# Patient Record
Sex: Female | Born: 1985 | Race: Black or African American | Hispanic: No | Marital: Married | Smoking: Never smoker
Health system: Southern US, Community
[De-identification: ages and names within clinical notes are randomized; demographics above are authoritative.]

## PROBLEM LIST (undated history)

## (undated) ENCOUNTER — Inpatient Hospital Stay (HOSPITAL_COMMUNITY): Payer: Self-pay

## (undated) DIAGNOSIS — L219 Seborrheic dermatitis, unspecified: Secondary | ICD-10-CM

## (undated) DIAGNOSIS — K8010 Calculus of gallbladder with chronic cholecystitis without obstruction: Secondary | ICD-10-CM

## (undated) DIAGNOSIS — K8050 Calculus of bile duct without cholangitis or cholecystitis without obstruction: Secondary | ICD-10-CM

## (undated) DIAGNOSIS — F419 Anxiety disorder, unspecified: Secondary | ICD-10-CM

## (undated) DIAGNOSIS — Z789 Other specified health status: Secondary | ICD-10-CM

## (undated) DIAGNOSIS — E878 Other disorders of electrolyte and fluid balance, not elsewhere classified: Secondary | ICD-10-CM

## (undated) HISTORY — PX: WISDOM TOOTH EXTRACTION: SHX21

## (undated) HISTORY — DX: Other disorders of electrolyte and fluid balance, not elsewhere classified: E87.8

---

## 2011-05-17 ENCOUNTER — Inpatient Hospital Stay (HOSPITAL_COMMUNITY): Payer: Medicaid Other

## 2011-05-17 ENCOUNTER — Inpatient Hospital Stay (HOSPITAL_COMMUNITY)
Admission: AD | Admit: 2011-05-17 | Discharge: 2011-05-17 | Disposition: A | Discharge status: Home / Self Care | Payer: Medicaid Other | Source: Ambulatory Visit | Attending: Obstetrics and Gynecology | Admitting: Obstetrics and Gynecology

## 2011-05-17 DIAGNOSIS — O209 Hemorrhage in early pregnancy, unspecified: Secondary | ICD-10-CM

## 2011-05-17 LAB — URINALYSIS, ROUTINE W REFLEX MICROSCOPIC
Bilirubin Urine: NEGATIVE
Ketones, ur: NEGATIVE mg/dL
Nitrite: NEGATIVE
Urobilinogen, UA: 0.2 mg/dL (ref 0.0–1.0)

## 2011-05-17 LAB — CBC
Hemoglobin: 13.5 g/dL (ref 12.0–15.0)
MCHC: 34.5 g/dL (ref 30.0–36.0)
Platelets: 296 10*3/uL (ref 150–400)
RBC: 4.63 MIL/uL (ref 3.87–5.11)

## 2011-05-17 LAB — WET PREP, GENITAL
Clue Cells Wet Prep HPF POC: NONE SEEN
Trich, Wet Prep: NONE SEEN

## 2011-05-17 LAB — POCT PREGNANCY, URINE: Preg Test, Ur: POSITIVE

## 2011-05-17 LAB — URINE MICROSCOPIC-ADD ON

## 2011-05-17 LAB — ABO/RH: ABO/RH(D): B POS

## 2011-05-18 LAB — GC/CHLAMYDIA PROBE AMP, GENITAL: GC Probe Amp, Genital: NEGATIVE

## 2011-06-23 LAB — HIV ANTIBODY (ROUTINE TESTING W REFLEX): HIV: NONREACTIVE

## 2011-06-23 LAB — RPR: RPR: NONREACTIVE

## 2011-11-29 ENCOUNTER — Ambulatory Visit
Payer: No Typology Code available for payment source | Attending: Obstetrics and Gynecology | Admitting: Physical Therapy

## 2011-11-29 DIAGNOSIS — IMO0001 Reserved for inherently not codable concepts without codable children: Secondary | ICD-10-CM | POA: Insufficient documentation

## 2011-11-29 DIAGNOSIS — M546 Pain in thoracic spine: Secondary | ICD-10-CM | POA: Insufficient documentation

## 2011-11-29 DIAGNOSIS — M2569 Stiffness of other specified joint, not elsewhere classified: Secondary | ICD-10-CM | POA: Insufficient documentation

## 2011-12-01 ENCOUNTER — Ambulatory Visit: Payer: No Typology Code available for payment source | Admitting: Physical Therapy

## 2011-12-06 ENCOUNTER — Ambulatory Visit: Payer: No Typology Code available for payment source | Admitting: Physical Therapy

## 2011-12-08 ENCOUNTER — Ambulatory Visit: Payer: No Typology Code available for payment source | Admitting: Physical Therapy

## 2011-12-12 ENCOUNTER — Encounter: Payer: No Typology Code available for payment source | Admitting: Physical Therapy

## 2011-12-19 ENCOUNTER — Ambulatory Visit: Payer: No Typology Code available for payment source | Admitting: Pediatrics

## 2011-12-20 ENCOUNTER — Ambulatory Visit: Payer: No Typology Code available for payment source | Admitting: Physical Therapy

## 2011-12-25 ENCOUNTER — Inpatient Hospital Stay (HOSPITAL_COMMUNITY)
Admission: AD | Admit: 2011-12-25 | Discharge: 2011-12-29 | DRG: 766 | Disposition: A | Discharge status: Home / Self Care | Payer: Medicaid Other | Source: Ambulatory Visit | Attending: Obstetrics and Gynecology | Admitting: Obstetrics and Gynecology

## 2011-12-25 ENCOUNTER — Encounter (HOSPITAL_COMMUNITY): Payer: Self-pay | Admitting: *Deleted

## 2011-12-25 DIAGNOSIS — IMO0002 Reserved for concepts with insufficient information to code with codable children: Secondary | ICD-10-CM | POA: Diagnosis present

## 2011-12-25 HISTORY — DX: Other specified health status: Z78.9

## 2011-12-25 LAB — COMPREHENSIVE METABOLIC PANEL
ALT: 29 U/L (ref 0–35)
AST: 22 U/L (ref 0–37)
CO2: 22 mEq/L (ref 19–32)
Chloride: 106 mEq/L (ref 96–112)
Creatinine, Ser: 0.61 mg/dL (ref 0.50–1.10)
GFR calc non Af Amer: >90 mL/min (ref >90)
Glucose, Bld: 87 mg/dL (ref 70–99)
Sodium: 140 mEq/L (ref 135–145)
Total Bilirubin: 0.3 mg/dL (ref 0.3–1.2)

## 2011-12-25 LAB — CBC
MCV: 84.4 fL (ref 78.0–100.0)
Platelets: 182 10*3/uL (ref 150–400)
RDW: 14.4 % (ref 11.5–15.5)
WBC: 9.2 10*3/uL (ref 4.0–10.5)

## 2011-12-25 MED ORDER — BUTORPHANOL TARTRATE 2 MG/ML IJ SOLN: 2.0000 mg | INTRAMUSCULAR | Status: DC | PRN

## 2011-12-25 MED ORDER — LACTATED RINGERS IV SOLN
INTRAVENOUS | Status: DC
  Administered 2011-12-25 – 2011-12-26 (×3): via INTRAVENOUS

## 2011-12-25 NOTE — Progress Notes (Signed)
Pt will go to room 173.  Will call when room is available.

## 2011-12-25 NOTE — Progress Notes (Signed)
Pt reports SROM at 1945-note clear fluid running down her legs

## 2011-12-25 NOTE — ED Provider Notes (Signed)
Brenda Lozano is a 26 y.o. female presenting c/o leaking fluid since 7:45p clear fluid.  She reports good fetal movement and ctxs but no bleeding.  History OB History    Grav Para Term Preterm Abortions TAB SAB Ect Mult Living   1              History reviewed. No pertinent past medical history. History reviewed. No pertinent past surgical history. Family History: family history is not on file. Social History:  does not have a smoking history on file. She does not have any smokeless tobacco history on file. Her alcohol and drug histories not on file. H/o HPV s/p Colpo  ROS Noncontributory.  Denies any other symptoms.  Dilation: Fingertip Effacement (%): 50 Station: Ballotable Exam by:: Dr. Su Hilt Blood pressure 146/87, pulse 103, temperature 98.7 F (37.1 C), temperature source Oral, resp. rate 20, height 5\' 4"  (1.626 m), weight 99.791 kg (220 lb), SpO2 97.00%.  Grossly ruptured clear fluid VE FT/50%/P FHT approx 130s, minimal variability, no decels Toco q 3-99min Bedside U/S vtx GBS neg  Exam Physical Exam  Prenatal labs: ABO, Rh: --/--/B POS (06/26 1941) Antibody:  neg Rubella:  Immune RPR:   NR HBsAg:   neg HIV:   NR GBS:   neg Pap neg GC/CT both neg Anatomy scan no anomalies Quad screen not done secondary to pt missed window of time  Assessment/Plan: 25yo P0 @ 40wks with SROM clear fluid and contracting.  Will continue to monitor as pt appears to be moving into labor.  I did discuss the possibility of augmenting with pitocin if she does not continue to progress.  Fetal status is overall reassuring.   Purcell Nails 12/25/2011, 10:12 PM

## 2011-12-25 NOTE — H&P (Addendum)
Brenda Lozano is a 26 y.o. female presenting c/o leaking fluid since 7:45p clear fluid. She reports good fetal movement and ctxs but no bleeding.  History  OB History    Grav  Para  Term  Preterm  Abortions  TAB  SAB  Ect  Mult  Living    1               History reviewed. No pertinent past medical history.  History reviewed. No pertinent past surgical history.  Family History: family history is not on file.  Social History: does not have a smoking history on file. She does not have any smokeless tobacco history on file. Her alcohol and drug histories not on file.  H/o HPV s/p Colpo   ROS  Noncontributory. Denies any other symptoms.   Dilation: Fingertip  Effacement (%): 50  Station: Ballotable  Exam by:: Dr. Su Hilt   Blood pressure 146/87, pulse 103, temperature 98.7 F (37.1 C), temperature source Oral, resp. rate 20, height 5\' 4"  (1.626 m), weight 99.791 kg (220 lb), SpO2 97.00%.  Grossly ruptured clear fluid  VE FT/50%/P  FHT approx 130s, minimal variability, no decels  Toco q 3-66min  Bedside U/S vtx  GBS neg   Exam  Physical Exam  Lungs CTA  bil CV RRR Abd gravid, NT Ext no calf tenderness  Prenatal labs:  ABO, Rh: --/--/B POS (06/26 1941)  Antibody: neg  Rubella: Immune  RPR: NR  HBsAg: neg  HIV: NR  GBS: neg  Pap neg  GC/CT both neg  Anatomy scan no anomalies  Quad screen not done secondary to pt missed window of time  U/S 7lbs 11oz (12/13/11) Fern Test positive  Assessment/Plan:  25yo P0 @ 40wks with SROM clear fluid and contracting. Will continue to monitor as pt appears to be moving into labor. I did discuss the possibility of augmenting with pitocin if she does not continue to progress. Fetal status is overall reassuring.  Purcell Nails  12/25/2011, 10:12 PM

## 2011-12-25 NOTE — Progress Notes (Signed)
Dr. Su Hilt at bedside.  Strip reviewed and VE done.

## 2011-12-26 ENCOUNTER — Encounter (HOSPITAL_COMMUNITY)
Admission: AD | Disposition: A | Discharge status: Home / Self Care | Payer: Self-pay | Source: Ambulatory Visit | Attending: Obstetrics and Gynecology

## 2011-12-26 ENCOUNTER — Encounter (HOSPITAL_COMMUNITY): Payer: Self-pay | Admitting: Anesthesiology

## 2011-12-26 ENCOUNTER — Inpatient Hospital Stay (HOSPITAL_COMMUNITY): Payer: Medicaid Other | Admitting: Anesthesiology

## 2011-12-26 ENCOUNTER — Encounter (HOSPITAL_COMMUNITY): Payer: Self-pay | Admitting: *Deleted

## 2011-12-26 DIAGNOSIS — IMO0002 Reserved for concepts with insufficient information to code with codable children: Secondary | ICD-10-CM | POA: Diagnosis present

## 2011-12-26 LAB — URINALYSIS, ROUTINE W REFLEX MICROSCOPIC
Bilirubin Urine: NEGATIVE
Glucose, UA: NEGATIVE mg/dL
Ketones, ur: 15 mg/dL — AB
Leukocytes, UA: NEGATIVE
Nitrite: NEGATIVE
Protein, ur: NEGATIVE mg/dL
Specific Gravity, Urine: 1.015 (ref 1.005–1.030)
Urobilinogen, UA: 0.2 mg/dL (ref 0.0–1.0)
pH: 6.5 (ref 5.0–8.0)

## 2011-12-26 LAB — URINE MICROSCOPIC-ADD ON

## 2011-12-26 LAB — RPR: RPR Ser Ql: NONREACTIVE

## 2011-12-26 SURGERY — Surgical Case
Anesthesia: Regional | Site: Abdomen | Wound class: Clean Contaminated

## 2011-12-26 MED ORDER — ACETAMINOPHEN 10 MG/ML IV SOLN: 1000.0000 mg | Freq: Four times a day (QID) | INTRAVENOUS | Status: AC | PRN

## 2011-12-26 MED ORDER — FERROUS SULFATE 325 (65 FE) MG PO TABS
325.0000 mg | ORAL_TABLET | Freq: Two times a day (BID) | ORAL | Status: DC
  Administered 2011-12-27 – 2011-12-29 (×4): 325 mg via ORAL
  Filled 2011-12-26 (×4): qty 1

## 2011-12-26 MED ORDER — SODIUM CHLORIDE 0.9 % IV SOLN: 1.0000 ug/kg/h | INTRAVENOUS | Status: DC | PRN

## 2011-12-26 MED ORDER — DIPHENHYDRAMINE HCL 50 MG/ML IJ SOLN: 12.5000 mg | INTRAMUSCULAR | Status: DC | PRN

## 2011-12-26 MED ORDER — CITRIC ACID-SODIUM CITRATE 334-500 MG/5ML PO SOLN
30.0000 mL | ORAL | Status: DC | PRN
  Administered 2011-12-26: 30 mL via ORAL
  Filled 2011-12-26: qty 15

## 2011-12-26 MED ORDER — FENTANYL CITRATE 0.05 MG/ML IJ SOLN
INTRAMUSCULAR | Status: AC
  Filled 2011-12-26: qty 5

## 2011-12-26 MED ORDER — ONDANSETRON HCL 4 MG PO TABS: 4.0000 mg | ORAL_TABLET | ORAL | Status: DC | PRN

## 2011-12-26 MED ORDER — DIPHENHYDRAMINE HCL 25 MG PO CAPS: 25.0000 mg | ORAL_CAPSULE | Freq: Four times a day (QID) | ORAL | Status: DC | PRN

## 2011-12-26 MED ORDER — SENNOSIDES-DOCUSATE SODIUM 8.6-50 MG PO TABS
2.0000 | ORAL_TABLET | Freq: Every day | ORAL | Status: DC
  Administered 2011-12-27 – 2011-12-28 (×2): 2 via ORAL

## 2011-12-26 MED ORDER — FENTANYL CITRATE 0.05 MG/ML IJ SOLN
INTRAMUSCULAR | Status: DC | PRN
  Administered 2011-12-26 (×2): 50 ug via INTRAVENOUS

## 2011-12-26 MED ORDER — MORPHINE SULFATE (PF) 0.5 MG/ML IJ SOLN
INTRAMUSCULAR | Status: DC | PRN
  Administered 2011-12-26: 3 mg via EPIDURAL
  Administered 2011-12-26: 2 mg via INTRAVENOUS

## 2011-12-26 MED ORDER — BUPIVACAINE HCL (PF) 0.25 % IJ SOLN
INTRAMUSCULAR | Status: AC
  Filled 2011-12-26: qty 30

## 2011-12-26 MED ORDER — OXYCODONE-ACETAMINOPHEN 5-325 MG PO TABS: 1.0000 | ORAL_TABLET | ORAL | Status: DC | PRN

## 2011-12-26 MED ORDER — SIMETHICONE 80 MG PO CHEW
80.0000 mg | CHEWABLE_TABLET | Freq: Three times a day (TID) | ORAL | Status: DC
  Administered 2011-12-26 – 2011-12-29 (×9): 80 mg via ORAL

## 2011-12-26 MED ORDER — NALBUPHINE HCL 10 MG/ML IJ SOLN
5.0000 mg | INTRAMUSCULAR | Status: DC | PRN
  Administered 2011-12-26: 10 mg via INTRAVENOUS
  Filled 2011-12-26: qty 1

## 2011-12-26 MED ORDER — DIPHENHYDRAMINE HCL 50 MG/ML IJ SOLN: 25.0000 mg | INTRAMUSCULAR | Status: DC | PRN

## 2011-12-26 MED ORDER — KETOROLAC TROMETHAMINE 30 MG/ML IJ SOLN
30.0000 mg | Freq: Four times a day (QID) | INTRAMUSCULAR | Status: AC | PRN
  Administered 2011-12-26: 30 mg via INTRAVENOUS
  Filled 2011-12-26: qty 1

## 2011-12-26 MED ORDER — LACTATED RINGERS IV SOLN
500.0000 mL | INTRAVENOUS | Status: DC | PRN
  Administered 2011-12-26: 500 mL via INTRAVENOUS

## 2011-12-26 MED ORDER — WITCH HAZEL-GLYCERIN EX PADS: 1.0000 "application " | MEDICATED_PAD | CUTANEOUS | Status: DC | PRN

## 2011-12-26 MED ORDER — LANOLIN HYDROUS EX OINT: 1.0000 "application " | TOPICAL_OINTMENT | CUTANEOUS | Status: DC | PRN

## 2011-12-26 MED ORDER — IBUPROFEN 600 MG PO TABS
600.0000 mg | ORAL_TABLET | Freq: Four times a day (QID) | ORAL | Status: DC
  Administered 2011-12-27 – 2011-12-29 (×8): 600 mg via ORAL
  Filled 2011-12-26 (×9): qty 1

## 2011-12-26 MED ORDER — MORPHINE SULFATE 0.5 MG/ML IJ SOLN
INTRAMUSCULAR | Status: AC
  Filled 2011-12-26: qty 10

## 2011-12-26 MED ORDER — OXYTOCIN 10 UNIT/ML IJ SOLN
INTRAMUSCULAR | Status: AC
  Filled 2011-12-26: qty 2

## 2011-12-26 MED ORDER — METHYLERGONOVINE MALEATE 0.2 MG PO TABS
0.2000 mg | ORAL_TABLET | ORAL | Status: DC | PRN
Start: 2011-12-26 — End: 2011-12-29

## 2011-12-26 MED ORDER — OXYCODONE-ACETAMINOPHEN 5-325 MG PO TABS
1.0000 | ORAL_TABLET | ORAL | Status: DC | PRN
  Administered 2011-12-27 – 2011-12-29 (×5): 1 via ORAL
  Filled 2011-12-26 (×5): qty 1

## 2011-12-26 MED ORDER — NALOXONE HCL 0.4 MG/ML IJ SOLN
0.4000 mg | INTRAMUSCULAR | Status: DC | PRN
Start: 2011-12-26 — End: 2011-12-29

## 2011-12-26 MED ORDER — EPHEDRINE 5 MG/ML INJ: 10.0000 mg | INTRAVENOUS | Status: DC | PRN

## 2011-12-26 MED ORDER — EPHEDRINE 5 MG/ML INJ
10.0000 mg | INTRAVENOUS | Status: DC | PRN
  Filled 2011-12-26: qty 4

## 2011-12-26 MED ORDER — ACETAMINOPHEN 325 MG PO TABS: 650.0000 mg | ORAL_TABLET | ORAL | Status: DC | PRN

## 2011-12-26 MED ORDER — ONDANSETRON HCL 4 MG/2ML IJ SOLN: 4.0000 mg | Freq: Four times a day (QID) | INTRAMUSCULAR | Status: DC | PRN

## 2011-12-26 MED ORDER — KETOROLAC TROMETHAMINE 30 MG/ML IJ SOLN: 30.0000 mg | Freq: Four times a day (QID) | INTRAMUSCULAR | Status: AC | PRN

## 2011-12-26 MED ORDER — CITRIC ACID-SODIUM CITRATE 334-500 MG/5ML PO SOLN
ORAL | Status: AC
  Administered 2011-12-26: 30 mL via ORAL
  Filled 2011-12-26: qty 15

## 2011-12-26 MED ORDER — ONDANSETRON HCL 4 MG/2ML IJ SOLN: 4.0000 mg | INTRAMUSCULAR | Status: DC | PRN

## 2011-12-26 MED ORDER — PHENYLEPHRINE 40 MCG/ML (10ML) SYRINGE FOR IV PUSH (FOR BLOOD PRESSURE SUPPORT): 80.0000 ug | PREFILLED_SYRINGE | INTRAVENOUS | Status: DC | PRN

## 2011-12-26 MED ORDER — PROMETHAZINE HCL 25 MG/ML IJ SOLN
12.5000 mg | INTRAMUSCULAR | Status: DC | PRN
  Administered 2011-12-26 (×2): 12.5 mg via INTRAVENOUS
  Filled 2011-12-26 (×2): qty 1

## 2011-12-26 MED ORDER — NALBUPHINE HCL 10 MG/ML IJ SOLN: 5.0000 mg | INTRAMUSCULAR | Status: DC | PRN

## 2011-12-26 MED ORDER — TETANUS-DIPHTH-ACELL PERTUSSIS 5-2.5-18.5 LF-MCG/0.5 IM SUSP
0.5000 mL | Freq: Once | INTRAMUSCULAR | Status: AC
  Administered 2011-12-27: 0.5 mL via INTRAMUSCULAR
  Filled 2011-12-26: qty 0.5

## 2011-12-26 MED ORDER — DIPHENHYDRAMINE HCL 25 MG PO CAPS
25.0000 mg | ORAL_CAPSULE | ORAL | Status: DC | PRN
  Administered 2011-12-28: 25 mg via ORAL
  Filled 2011-12-26: qty 1

## 2011-12-26 MED ORDER — FLEET ENEMA 7-19 GM/118ML RE ENEM: 1.0000 | ENEMA | RECTAL | Status: DC | PRN

## 2011-12-26 MED ORDER — LIDOCAINE-EPINEPHRINE (PF) 2 %-1:200000 IJ SOLN
INTRAMUSCULAR | Status: AC
  Filled 2011-12-26: qty 20

## 2011-12-26 MED ORDER — MEASLES, MUMPS & RUBELLA VAC ~~LOC~~ INJ: 0.5000 mL | INJECTION | Freq: Once | SUBCUTANEOUS | Status: DC

## 2011-12-26 MED ORDER — SCOPOLAMINE 1 MG/3DAYS TD PT72: 1.0000 | MEDICATED_PATCH | Freq: Once | TRANSDERMAL | Status: DC

## 2011-12-26 MED ORDER — METHYLERGONOVINE MALEATE 0.2 MG/ML IJ SOLN: 0.2000 mg | INTRAMUSCULAR | Status: DC | PRN

## 2011-12-26 MED ORDER — SODIUM CHLORIDE 0.9 % IJ SOLN: 3.0000 mL | INTRAMUSCULAR | Status: DC | PRN

## 2011-12-26 MED ORDER — LIDOCAINE HCL (PF) 1 % IJ SOLN: 30.0000 mL | INTRAMUSCULAR | Status: DC | PRN

## 2011-12-26 MED ORDER — ZOLPIDEM TARTRATE 5 MG PO TABS: 5.0000 mg | ORAL_TABLET | Freq: Every evening | ORAL | Status: DC | PRN

## 2011-12-26 MED ORDER — SODIUM BICARBONATE 8.4 % IV SOLN
INTRAVENOUS | Status: AC
  Filled 2011-12-26: qty 50

## 2011-12-26 MED ORDER — ONDANSETRON HCL 4 MG/2ML IJ SOLN: 4.0000 mg | Freq: Three times a day (TID) | INTRAMUSCULAR | Status: DC | PRN

## 2011-12-26 MED ORDER — LIDOCAINE HCL 1.5 % IJ SOLN
INTRAMUSCULAR | Status: DC | PRN
Start: 2011-12-26 — End: 2011-12-26
  Administered 2011-12-26 (×2): 5 mL via EPIDURAL

## 2011-12-26 MED ORDER — PHENYLEPHRINE 40 MCG/ML (10ML) SYRINGE FOR IV PUSH (FOR BLOOD PRESSURE SUPPORT)
80.0000 ug | PREFILLED_SYRINGE | INTRAVENOUS | Status: DC | PRN
  Filled 2011-12-26: qty 5

## 2011-12-26 MED ORDER — PROMETHAZINE HCL 25 MG/ML IJ SOLN: 6.2500 mg | INTRAMUSCULAR | Status: DC | PRN

## 2011-12-26 MED ORDER — ACETAMINOPHEN 325 MG PO TABS: 325.0000 mg | ORAL_TABLET | ORAL | Status: DC | PRN

## 2011-12-26 MED ORDER — DIBUCAINE 1 % RE OINT: 1.0000 "application " | TOPICAL_OINTMENT | RECTAL | Status: DC | PRN

## 2011-12-26 MED ORDER — CEFAZOLIN SODIUM-DEXTROSE 2-3 GM-% IV SOLR
2.0000 g | Freq: Once | INTRAVENOUS | Status: AC
  Administered 2011-12-26 (×2): 2 g via INTRAVENOUS
  Filled 2011-12-26: qty 50

## 2011-12-26 MED ORDER — IBUPROFEN 600 MG PO TABS: 600.0000 mg | ORAL_TABLET | Freq: Four times a day (QID) | ORAL | Status: DC | PRN

## 2011-12-26 MED ORDER — FENTANYL 2.5 MCG/ML BUPIVACAINE 1/10 % EPIDURAL INFUSION (WH - ANES)
14.0000 mL/h | INTRAMUSCULAR | Status: DC
  Administered 2011-12-26 (×2): 14 mL/h via EPIDURAL
  Filled 2011-12-26 (×2): qty 60

## 2011-12-26 MED ORDER — BUPIVACAINE HCL (PF) 0.25 % IJ SOLN
INTRAMUSCULAR | Status: DC | PRN
  Administered 2011-12-26: 20 mL

## 2011-12-26 MED ORDER — SODIUM BICARBONATE 8.4 % IV SOLN
INTRAVENOUS | Status: DC | PRN
  Administered 2011-12-26: 5 mL via EPIDURAL

## 2011-12-26 MED ORDER — OXYTOCIN 20 UNITS IN LACTATED RINGERS INFUSION - SIMPLE
INTRAVENOUS | Status: AC
  Filled 2011-12-26: qty 1000

## 2011-12-26 MED ORDER — BUTORPHANOL TARTRATE 2 MG/ML IJ SOLN
1.0000 mg | INTRAMUSCULAR | Status: DC | PRN
  Administered 2011-12-26 (×3): 1 mg via INTRAVENOUS
  Filled 2011-12-26 (×3): qty 1

## 2011-12-26 MED ORDER — ONDANSETRON HCL 4 MG/2ML IJ SOLN
INTRAMUSCULAR | Status: AC
  Filled 2011-12-26: qty 2

## 2011-12-26 MED ORDER — PRENATAL MULTIVITAMIN CH
1.0000 | ORAL_TABLET | Freq: Every day | ORAL | Status: DC
  Administered 2011-12-28 – 2011-12-29 (×2): 1 via ORAL
  Filled 2011-12-26 (×2): qty 1

## 2011-12-26 MED ORDER — PRENATAL MULTIVITAMIN CH: 1.0000 | ORAL_TABLET | Freq: Every day | ORAL | Status: DC

## 2011-12-26 MED ORDER — OXYTOCIN BOLUS FROM INFUSION
500.0000 mL | Freq: Once | INTRAVENOUS | Status: DC
  Filled 2011-12-26: qty 500

## 2011-12-26 MED ORDER — MEPERIDINE HCL 25 MG/ML IJ SOLN: 6.2500 mg | INTRAMUSCULAR | Status: DC | PRN

## 2011-12-26 MED ORDER — SIMETHICONE 80 MG PO CHEW: 80.0000 mg | CHEWABLE_TABLET | ORAL | Status: DC | PRN

## 2011-12-26 MED ORDER — OXYTOCIN 20 UNITS IN LACTATED RINGERS INFUSION - SIMPLE
1.0000 m[IU]/min | INTRAVENOUS | Status: DC
  Administered 2011-12-26: 1 m[IU]/min via INTRAVENOUS

## 2011-12-26 MED ORDER — FENTANYL CITRATE 0.05 MG/ML IJ SOLN: 25.0000 ug | INTRAMUSCULAR | Status: DC | PRN

## 2011-12-26 MED ORDER — MENTHOL 3 MG MT LOZG: 1.0000 | LOZENGE | OROMUCOSAL | Status: DC | PRN

## 2011-12-26 MED ORDER — OXYTOCIN 20 UNITS IN LACTATED RINGERS INFUSION - SIMPLE
125.0000 mL/h | Freq: Once | INTRAVENOUS | Status: DC
  Filled 2011-12-26: qty 1000

## 2011-12-26 MED ORDER — METOCLOPRAMIDE HCL 5 MG/ML IJ SOLN: 10.0000 mg | Freq: Three times a day (TID) | INTRAMUSCULAR | Status: DC | PRN

## 2011-12-26 MED ORDER — OXYTOCIN 20 UNITS IN LACTATED RINGERS INFUSION - SIMPLE
125.0000 mL/h | INTRAVENOUS | Status: AC
  Administered 2011-12-26: 125 mL/h via INTRAVENOUS

## 2011-12-26 MED ORDER — TERBUTALINE SULFATE 1 MG/ML IJ SOLN: 0.2500 mg | Freq: Once | INTRAMUSCULAR | Status: DC | PRN

## 2011-12-26 MED ORDER — LACTATED RINGERS IV SOLN
500.0000 mL | Freq: Once | INTRAVENOUS | Status: AC
  Administered 2011-12-26 (×2): via INTRAVENOUS
  Administered 2011-12-26: 500 mL via INTRAVENOUS

## 2011-12-26 SURGICAL SUPPLY — 34 items
BENZOIN TINCTURE PRP APPL 2/3 (GAUZE/BANDAGES/DRESSINGS) ×2 IMPLANT
BOOTIES KNEE HIGH SLOAN (MISCELLANEOUS) ×4 IMPLANT
CHLORAPREP W/TINT 26ML (MISCELLANEOUS) ×2 IMPLANT
CLOTH BEACON ORANGE TIMEOUT ST (SAFETY) ×2 IMPLANT
DRAIN JACKSON PRT FLT 10 (DRAIN) IMPLANT
DRESSING TELFA 8X3 (GAUZE/BANDAGES/DRESSINGS) ×2 IMPLANT
ELECT REM PT RETURN 9FT ADLT (ELECTROSURGICAL) ×2
ELECTRODE REM PT RTRN 9FT ADLT (ELECTROSURGICAL) ×1 IMPLANT
EVACUATOR SILICONE 100CC (DRAIN) IMPLANT
EXTRACTOR VACUUM M CUP 4 TUBE (SUCTIONS) IMPLANT
GAUZE SPONGE 4X4 12PLY STRL LF (GAUZE/BANDAGES/DRESSINGS) ×4 IMPLANT
GLOVE BIOGEL PI IND STRL 7.0 (GLOVE) ×2 IMPLANT
GLOVE BIOGEL PI INDICATOR 7.0 (GLOVE) ×2
GLOVE ECLIPSE 6.5 STRL STRAW (GLOVE) ×2 IMPLANT
GOWN PREVENTION PLUS LG XLONG (DISPOSABLE) ×6 IMPLANT
KIT ABG SYR 3ML LUER SLIP (SYRINGE) IMPLANT
NEEDLE HYPO 22GX1.5 SAFETY (NEEDLE) ×4 IMPLANT
NEEDLE HYPO 25X5/8 SAFETYGLIDE (NEEDLE) IMPLANT
NS IRRIG 1000ML POUR BTL (IV SOLUTION) ×2 IMPLANT
PACK C SECTION WH (CUSTOM PROCEDURE TRAY) ×2 IMPLANT
PAD ABD 7.5X8 STRL (GAUZE/BANDAGES/DRESSINGS) ×2 IMPLANT
RTRCTR C-SECT PINK 25CM LRG (MISCELLANEOUS) ×2 IMPLANT
SLEEVE SCD COMPRESS KNEE MED (MISCELLANEOUS) ×2 IMPLANT
STRIP CLOSURE SKIN 1/2X4 (GAUZE/BANDAGES/DRESSINGS) ×2 IMPLANT
SUT CHROMIC GUT AB #0 18 (SUTURE) IMPLANT
SUT MNCRL AB 3-0 PS2 27 (SUTURE) ×2 IMPLANT
SUT SILK 2 0 FSL 18 (SUTURE) IMPLANT
SUT VIC AB 0 CTX 36 (SUTURE) ×2
SUT VIC AB 0 CTX36XBRD ANBCTRL (SUTURE) ×2 IMPLANT
SUT VIC AB 1 CT1 36 (SUTURE) ×4 IMPLANT
SYR 20CC LL (SYRINGE) ×2 IMPLANT
TOWEL OR 17X24 6PK STRL BLUE (TOWEL DISPOSABLE) ×4 IMPLANT
TRAY FOLEY CATH 14FR (SET/KITS/TRAYS/PACK) ×2 IMPLANT
WATER STERILE IRR 1000ML POUR (IV SOLUTION) ×2 IMPLANT

## 2011-12-26 NOTE — Anesthesia Postprocedure Evaluation (Signed)
Anesthesia Post Note  Patient: Brenda Lozano  Procedure(s) Performed:  CESAREAN SECTION  Anesthesia type: Epidural  Patient location: PACU  Post pain: Pain level controlled  Post assessment: Post-op Vital signs reviewed  Last Vitals:  Filed Vitals:   12/26/11 1615  BP: 123/84  Pulse: 96  Temp:   Resp: 23    Post vital signs: Reviewed  Level of consciousness: awake  Complications: No apparent anesthesia complications

## 2011-12-26 NOTE — Anesthesia Preprocedure Evaluation (Signed)

## 2011-12-26 NOTE — Addendum Note (Signed)
Addendum  created 12/26/11 1725 by Korban Shearer, CRNA   Modules edited:Notes Section    

## 2011-12-26 NOTE — Progress Notes (Signed)
  Subjective: pain a 5 with contractions, agrees to PItocin   Objective: BP 136/73  Pulse 91  Temp(Src) 98.7 F (37.1 C) (Oral)  Resp 20  Ht 5\' 4"  (1.626 m)  Wt 220 lb (99.791 kg)  BMI 37.76 kg/m2  SpO2 98%      FHT:  120 LTV min to mod UC:   regular, every 2-6 minutes SVE:   Dilation: 1 Effacement (%): 100 Station: -1 Exam by:: Joice Lofts CNM  Labs: Lab Results  Component Value Date   WBC 9.2 12/25/2011   HGB 12.8 12/25/2011   HCT 37.8 12/25/2011   MCV 84.4 12/25/2011   PLT 182 12/25/2011   Results for orders placed during the hospital encounter of 12/25/11 (from the past 24 hour(s))  POCT FERN TEST     Status: Normal   Collection Time   12/25/11 10:00 PM      Component Value Range   Fern Test Positive    COMPREHENSIVE METABOLIC PANEL     Status: Abnormal   Collection Time   12/25/11 10:35 PM      Component Value Range   Sodium 140  135 - 145 (mEq/L)   Potassium 4.0  3.5 - 5.1 (mEq/L)   Chloride 106  96 - 112 (mEq/L)   CO2 22  19 - 32 (mEq/L)   Glucose, Bld 87  70 - 99 (mg/dL)   BUN 5 (*) 6 - 23 (mg/dL)   Creatinine, Ser 6.96  0.50 - 1.10 (mg/dL)   Calcium 9.6  8.4 - 29.5 (mg/dL)   Total Protein 6.1  6.0 - 8.3 (g/dL)   Albumin 2.7 (*) 3.5 - 5.2 (g/dL)   AST 22  0 - 37 (U/L)   ALT 29  0 - 35 (U/L)   Alkaline Phosphatase 127 (*) 39 - 117 (U/L)   Total Bilirubin 0.3  0.3 - 1.2 (mg/dL)   GFR calc non Af Amer >90  >90 (mL/min)   GFR calc Af Amer >90  >90 (mL/min)  LACTATE DEHYDROGENASE     Status: Normal   Collection Time   12/25/11 10:35 PM      Component Value Range   LD 249  94 - 250 (U/L)  URIC ACID     Status: Normal   Collection Time   12/25/11 10:35 PM      Component Value Range   Uric Acid, Serum 4.4  2.4 - 7.0 (mg/dL)  CBC     Status: Normal   Collection Time   12/25/11 10:54 PM      Component Value Range   WBC 9.2  4.0 - 10.5 (K/uL)   RBC 4.48  3.87 - 5.11 (MIL/uL)   Hemoglobin 12.8  12.0 - 15.0 (g/dL)   HCT 28.4  13.2 - 44.0 (%)   MCV 84.4  78.0 -  100.0 (fL)   MCH 28.6  26.0 - 34.0 (pg)   MCHC 33.9  30.0 - 36.0 (g/dL)   RDW 10.2  72.5 - 36.6 (%)   Platelets 182  150 - 400 (K/uL)   Assessment / Plan: SROM 1/7 week IUP HTN Plan Pitocin augmenation, IV meds now, epidural if desires, discussed limited vag exams with RN, Sawsan Riggio 12/26/2011, 2:12 AM

## 2011-12-26 NOTE — Progress Notes (Signed)
Asleep, not disturbed O VSS      Fhts 120 LTV min now with accels last 20 minutes      uc q 1-6 coupling      Vag not assessed A 40 1/7 week IUP     SROM since 1930 P IV pitocin at 4 continue care Lavera Guise, CNM

## 2011-12-26 NOTE — Addendum Note (Signed)
Addendum  created 12/26/11 1707 by Velna Hatchet, MD   Modules edited:Orders, PRL Based Order Sets

## 2011-12-26 NOTE — Progress Notes (Signed)
Patient ID: Celenia Hruska, female   DOB: [birth year], 26 y.o.   MRN: [id number] .Subjective: Comfortable with epidural   Objective: BP 122/70  Pulse 83  Temp(Src) 97.9 F (36.6 C) (Oral)  Resp 18  Ht 5\' 4"  (1.626 m)  Wt 99.791 kg (220 lb)  BMI 37.76 kg/m2  SpO2 100%   FHT:  FHR: 130 bpm, variability: marked,  accelerations:  Present,  decelerations:  Present late/variables UC:   regular, every 2-3  minutes SVE:   Dilation: 7 Effacement (%): 100 Station: 0 Exam by:: S.Tawona Filsinger CNM    Assessment / Plan: Induction of labor due to SROM,  progressing well on pitocin GBS  IUPC and FSE placed without difficulty  Fetal Wellbeing:  Category II Pain Control:  Epidural  Dr Estanislado Pandy updated  Malissa Hippo 12/26/2011, 1:49 PM

## 2011-12-26 NOTE — Op Note (Signed)
Preoperative diagnosis: Intrauterine pregnancy at 40 weeks and 1 days with failure to progress and non-reassuring fetal heart rate  Post operative diagnosis: Same  Anesthesia: Spinal  Anesthesiologist: Dr. Brayton Caves  Procedure: Primary low transverse cesarean section  Surgeon: Dr. Dois Davenport Joury Allcorn  Assistant: Sanda Klein CNM  Estimated blood loss: 700 cc  Procedure:  After being informed of the planned procedure and possible complications including bleeding, infection, injury to other organs, informed consent is obtained. The patient is taken to OR #1 and given spinal anesthesia without complication. She is placed in the dorsal decubitus position with the pelvis tilted to the left. She is then prepped and draped in a sterile fashion. A Foley catheter is inserted in her bladder.  After assessing adequate level of anesthesia, we infiltrate the suprapubic area with 20 cc of Marcaine 0.25 and perform a Pfannenstiel incision which is brought down sharply to the fascia. The fascia is entered in a low transverse fashion. Linea alba is dissected. Peritoneum is entered in a midline fashion. An Alexis retractor is easily positioned. Visceral peritoneum is entered in a low transverse fashion allowing [place] to safely retract bladder by developing a bladder flap.  The myometrium is then entered in a low transverse fashion; first with knife and then extended bluntly. Amniotic fluid is clear. We assist the birth of a female  infant in vertex presentation. Mouth and nose are suctioned.We note a shoulder cord loop. The baby is delivered. The cord is clamped and sectioned. The baby is given to the neonatologist present in the room.  The placenta is allowed to deliver spontaneously. It is complete and the cord has 3 vessels. Uterine revision is negative. Placenta is sent for cord blood donation.  We proceed with closure of the myometrium in 2 layers: First with a running locked suture of 0 Vicryl, then with  a Lembert suture of 0 Vicryl imbricating the first one. Hemostasis is completed with cauterization on peritoneal edges.  Both paracolic gutters are cleaned. Both tubes and ovaries are assessed and normal. The pelvis is profusely irrigated with warm saline to confirm a satisfactory hemostasis.  Retractors and sponges are removed. Under fascia hemostasis is completed with cauterization. The fascia is then closed with 2 running sutures of 0 Vicryl meeting midline. The wound is irrigated with warm saline and hemostasis is completed with cauterization. The skin is closed with a subcuticular suture of 3-0 Monocryl and Steri-Strips.  Instrument and sponge count is complete x2. Estimated blood loss is 700 cc.  The procedure is well tolerated by the patient who is taken to recovery room in a well and stable condition.  female baby named Thurston Pounds was born at 14:55 and received an Apgar of 8  at 1 minute and 9 at 5 minutes. Weight is pending.   Specimen: Placenta sent to [facility] & [facility]   Katheleen Stella A MD 2/4/20133:47 PM

## 2011-12-26 NOTE — Progress Notes (Signed)
Patient ID: Brenda Lozano, female   DOB: 1986/01/06, 26 y.o.   MRN: [id number] .Subjective: Has epidural now   Objective: BP 123/75  Pulse 97  Temp(Src) 98.1 F (36.7 C) (Oral)  Resp 20  Ht 5\' 4"  (1.626 m)  Wt 99.791 kg (220 lb)  BMI 37.76 kg/m2  SpO2 100%   FHT:  FHR: 130 bpm, variability: moderate,  accelerations:  Present,  decelerations:  Present late/variables UC:   regular, every 2-3 minutes SVE:   Dilation: 5.5 Effacement (%): 90 Station: -1;0 Exam by:: Brenda Lozano RNC   Assessment / Plan: SROM active labor GBS neg  Fetal Wellbeing:  Category II Pain Control:  Epidural  Update physician PRN  Brenda Lozano M 12/26/2011, 9:15 AM

## 2011-12-26 NOTE — Progress Notes (Signed)
C/o of painful uc O Fhts 120s LTV mod accels     uc q 3 mild to mod prolonged at times     Vag not assessed A 40 17 week IUP     SROM P IV meds now if not effective plan epidural Lavera Guise, CNM

## 2011-12-26 NOTE — Transfer of Care (Signed)
Immediate Anesthesia Transfer of Care Note  Patient: Brenda Lozano  Procedure(s) Performed:  CESAREAN SECTION  Patient Location: PACU  Anesthesia Type: Spinal  Level of Consciousness: awake, alert  and oriented  Airway & Oxygen Therapy: Patient Spontanous Breathing  Post-op Assessment: Report given to PACU RN and Post -op Vital signs reviewed and stable  Post vital signs: stable  Complications: No apparent anesthesia complications

## 2011-12-26 NOTE — Progress Notes (Signed)
Pt offered pain meds.  Denies need at this time.

## 2011-12-26 NOTE — Brief Op Note (Signed)
12/25/2011 - 12/26/2011  3:42 PM  PATIENT:  Brenda Lozano  26 y.o. female  PRE-OPERATIVE DIAGNOSIS: IUP at 40+1 weeks, Failure to progress and Non-reasuring fetal heart tracing  POST-OPERATIVE DIAGNOSIS:  same    Procedure(s): CESAREAN SECTION    Surgeon(s): Esmeralda Arthur, MD   ASSISTANTS: Sanda Klein CNM   ANESTHESIA:   local and epidural  LOCAL MEDICATIONS USED:  MARCAINE 20 CC  SPECIMEN:  placenta  DISPOSITION OF SPECIMEN:  L & D  COUNTS:  YES  ESTIMATED BLOOD LOSS: 700 cc  PATIENT DISPOSITION:  PACU - hemodynamically stable.       [facility] AMD 12/26/2011 3:42 PM

## 2011-12-26 NOTE — Progress Notes (Signed)
S. Lillard oncoming shift at 0700 update at 0600 on pt status as written. Lavera Guise, CNM

## 2011-12-26 NOTE — Addendum Note (Signed)
Addendum  created 12/26/11 1725 by Truitt Leep, CRNA   Modules edited:Notes Section

## 2011-12-26 NOTE — Progress Notes (Signed)
Discussed with pt FHR pattern and lack of progress and that c/s was recommended at this time.  Explained risks of increased bleeding, damage to organs, infection and anesthesia complications. Pt agrees to proceed Dr. Estanislado Pandy talked to pt as well.

## 2011-12-26 NOTE — Anesthesia Procedure Notes (Signed)
Epidural Patient location during procedure: OB Start time: 12/26/2011 8:00 AM  Staffing Anesthesiologist: Brayton Caves R Performed by: anesthesiologist   Preanesthetic Checklist Completed: patient identified, site marked, surgical consent, pre-op evaluation, timeout performed, IV checked, risks and benefits discussed and monitors and equipment checked  Epidural Patient position: sitting Prep: site prepped and draped and DuraPrep Patient monitoring: continuous pulse ox and blood pressure Approach: midline Injection technique: LOR air and LOR saline  Needle:  Needle type: Tuohy  Needle gauge: 17 G Needle length: 9 cm Needle insertion depth: 8 cm Catheter type: closed end flexible Catheter size: 19 Gauge Catheter at skin depth: 13 cm Test dose: negative  Assessment Events: blood not aspirated, injection not painful, no injection resistance, negative IV test and no paresthesia  Additional Notes Patient identified.  Risk benefits discussed including failed block, incomplete pain control, headache, nerve damage, paralysis, blood pressure changes, nausea, vomiting, reactions to medication both toxic or allergic, and postpartum back pain.  Patient expressed understanding and wished to proceed.  All questions were answered.  Sterile technique used throughout procedure and epidural site dressed with sterile barrier dressing. No paresthesia or other complications noted.The patient did not experience any signs of intravascular injection such as tinnitus or metallic taste in mouth nor signs of intrathecal spread such as rapid motor block. Please see nursing notes for vital signs.

## 2011-12-26 NOTE — Progress Notes (Signed)
Pt may go to room 173 at this time.

## 2011-12-26 NOTE — Progress Notes (Signed)
S: called to evaluate pt for NRFHR and FTP.  O: Currently on Pitocin at 4 mU/min with ctx every 5-8 minutes and MVU around 110      FHR at 135 bpm with repetitive late deceleration      With D/C Pitocin FHR is reassuring      VE: unchanged at 7 cm for 2 hours      BP 135/93  A: NRFHR with FTP  P: recommend to proceed with cesarean section     Procedure and possible complications reviewed with pt and FOB with family in room     Questions answered. Patient agreeable to proceed

## 2011-12-26 NOTE — Anesthesia Postprocedure Evaluation (Signed)
  Anesthesia Post-op Note  Patient: Brenda Lozano  Procedure(s) Performed:  CESAREAN SECTION  Patient Location: PACU and Mother/Baby  Anesthesia Type: Epidural  Level of Consciousness: awake, alert  and oriented  Airway and Oxygen Therapy: Patient Spontanous Breathing  Post-op Pain: mild  Post-op Assessment: Post-op Vital signs reviewed, Patient's Cardiovascular Status Stable and Respiratory Function Stable  Post-op Vital Signs: stable  Complications: No apparent anesthesia complications

## 2011-12-27 ENCOUNTER — Encounter (HOSPITAL_COMMUNITY): Payer: Self-pay | Admitting: Obstetrics and Gynecology

## 2011-12-27 LAB — CREATININE CLEARANCE, URINE, 24 HOUR
Creatinine Clearance: 167 mL/min — ABNORMAL HIGH (ref 75–115)
Creatinine, 24H Ur: 2111 mg/d — ABNORMAL HIGH (ref 700–1800)
Creatinine, Urine: 80.42 mg/dL
Creatinine: 0.88 mg/dL (ref 0.50–1.10)

## 2011-12-27 LAB — CBC
Hemoglobin: 11 g/dL — ABNORMAL LOW (ref 12.0–15.0)
MCH: 28.3 pg (ref 26.0–34.0)
MCV: 85.9 fL (ref 78.0–100.0)
RBC: 3.89 MIL/uL (ref 3.87–5.11)

## 2011-12-27 LAB — COMPREHENSIVE METABOLIC PANEL
CO2: 27 mEq/L (ref 19–32)
Calcium: 8.3 mg/dL — ABNORMAL LOW (ref 8.4–10.5)
Creatinine, Ser: 0.88 mg/dL (ref 0.50–1.10)
GFR calc Af Amer: >90 mL/min (ref >90)
GFR calc non Af Amer: >90 mL/min (ref >90)
Glucose, Bld: 84 mg/dL (ref 70–99)

## 2011-12-27 LAB — PROTEIN, URINE, 24 HOUR
Collection Interval-UPROT: 24 hours
Urine Total Volume-UPROT: 2625 mL

## 2011-12-27 LAB — URIC ACID: Uric Acid, Serum: 4.6 mg/dL (ref 2.4–7.0)

## 2011-12-27 NOTE — Anesthesia Postprocedure Evaluation (Signed)
  Anesthesia Post-op Note  Patient: Brenda Lozano  Procedure(s) Performed:  CESAREAN SECTION  Patient Location: PACU and Mother/Baby  Anesthesia Type: Epidural  Level of Consciousness: awake, alert  and oriented  Airway and Oxygen Therapy: Patient Spontanous Breathing  Post-op Pain: mild  Post-op Assessment: Patient's Cardiovascular Status Stable, Respiratory Function Stable and No signs of Nausea or vomiting  Post-op Vital Signs: stable  Complications: No apparent anesthesia complications

## 2011-12-27 NOTE — Progress Notes (Signed)
UR Chart review completed.  

## 2011-12-27 NOTE — Progress Notes (Signed)
Subjective: Postpartum Day 1: Cesarean Delivery Patient reports tolerating PO, + flatus and no problems voiding.  Desire outpt circ.  Working on breastfeeding.  Last BM Sunday.  VB light. Pt c/o of moderate "itching;" hasn't tried any antihistamine PP. No dizziness when up so far, and no PIH s/s.  24 hr urine still pending--completed this AM. FOB's mother taken to [facility] for chest pain and arm numbness this AM--she is here for birth and attended a conference this AM.   Objective: Vital signs in last 24 hours: Temp:  [97.7 F (36.5 C)-99.6 F (37.6 C)] 98.2 F (36.8 C) (02/05 0430) Pulse Rate:  [83-114] 96  (02/05 0430) Resp:  [18-29] 18  (02/05 0430) BP: (107-135)/(66-93) 114/77 mmHg (02/05 0430) SpO2:  [98 %-100 %] 99 % (02/05 0430)  Physical Exam:  General: alert, cooperative and no distress Lochia: appropriate Uterine Fundus: firm, below umbilicus Incision: no significant drainage; post-op dsg c/d/i; Abd:  Soft, appropriately tender; BS+x4 DVT Evaluation: No evidence of DVT seen on physical exam. Negative Homan's sign. No significant calf/ankle edema.   Basename 12/27/11 0510 12/25/11 2254  HGB 11.0* 12.8  HCT 33.4* 37.8  .Marland Kitchen Results for orders placed during the hospital encounter of 12/25/11 (from the past 48 hour(s))  POCT FERN TEST     Status: Normal   Collection Time   12/25/11 10:00 PM      Component Value Range Comment   Fern Test Positive     COMPREHENSIVE METABOLIC PANEL     Status: Abnormal   Collection Time   12/25/11 10:35 PM      Component Value Range Comment   Sodium 140  135 - 145 (mEq/L)    Potassium 4.0  3.5 - 5.1 (mEq/L)    Chloride 106  96 - 112 (mEq/L)    CO2 22  19 - 32 (mEq/L)    Glucose, Bld 87  70 - 99 (mg/dL)    BUN 5 (*) 6 - 23 (mg/dL)    Creatinine, Ser 1.61  0.50 - 1.10 (mg/dL)    Calcium 9.6  8.4 - 10.5 (mg/dL)    Total Protein 6.1  6.0 - 8.3 (g/dL)    Albumin 2.7 (*) 3.5 - 5.2 (g/dL)    AST 22  0 - 37 (U/L)    ALT 29  0 - 35 (U/L)      Alkaline Phosphatase 127 (*) 39 - 117 (U/L)    Total Bilirubin 0.3  0.3 - 1.2 (mg/dL)    GFR calc non Af Amer >90  >90 (mL/min)    GFR calc Af Amer >90  >90 (mL/min)   LACTATE DEHYDROGENASE     Status: Normal   Collection Time   12/25/11 10:35 PM      Component Value Range Comment   LD 249  94 - 250 (U/L)   URIC ACID     Status: Normal   Collection Time   12/25/11 10:35 PM      Component Value Range Comment   Uric Acid, Serum 4.4  2.4 - 7.0 (mg/dL)   CBC     Status: Normal   Collection Time   12/25/11 10:54 PM      Component Value Range Comment   WBC 9.2  4.0 - 10.5 (K/uL)    RBC 4.48  3.87 - 5.11 (MIL/uL)    Hemoglobin 12.8  12.0 - 15.0 (g/dL)    HCT 09.6  04.5 - 40.9 (%)    MCV 84.4  78.0 - 100.0 (  fL)    MCH 28.6  26.0 - 34.0 (pg)    MCHC 33.9  30.0 - 36.0 (g/dL)    RDW 09.8  11.9 - 14.7 (%)    Platelets 182  150 - 400 (K/uL)   RPR     Status: Normal   Collection Time   12/25/11 10:54 PM      Component Value Range Comment   RPR NON REACTIVE  NON REACTIVE    URINALYSIS, ROUTINE W REFLEX MICROSCOPIC     Status: Abnormal   Collection Time   12/26/11  9:25 AM      Component Value Range Comment   Color, Urine YELLOW  YELLOW     APPearance CLEAR  CLEAR     Specific Gravity, Urine 1.015  1.005 - 1.030     pH 6.5  5.0 - 8.0     Glucose, UA NEGATIVE  NEGATIVE (mg/dL)    Hgb urine dipstick TRACE (*) NEGATIVE     Bilirubin Urine NEGATIVE  NEGATIVE     Ketones, ur 15 (*) NEGATIVE (mg/dL)    Protein, ur NEGATIVE  NEGATIVE (mg/dL)    Urobilinogen, UA 0.2  0.0 - 1.0 (mg/dL)    Nitrite NEGATIVE  NEGATIVE     Leukocytes, UA NEGATIVE  NEGATIVE    URINE MICROSCOPIC-ADD ON     Status: Abnormal   Collection Time   12/26/11  9:25 AM      Component Value Range Comment   Squamous Epithelial / LPF RARE  RARE     WBC, UA 0-2  <3 (WBC/hpf)    RBC / HPF 3-6  <3 (RBC/hpf)    Bacteria, UA FEW (*) RARE     Urine-Other MUCOUS PRESENT     CBC     Status: Abnormal   Collection Time   12/27/11  5:10  AM      Component Value Range Comment   WBC 13.2 (*) 4.0 - 10.5 (K/uL)    RBC 3.89  3.87 - 5.11 (MIL/uL)    Hemoglobin 11.0 (*) 12.0 - 15.0 (g/dL)    HCT 82.9 (*) 56.2 - 46.0 (%)    MCV 85.9  78.0 - 100.0 (fL)    MCH 28.3  26.0 - 34.0 (pg)    MCHC 32.9  30.0 - 36.0 (g/dL)    RDW 13.0  86.5 - 78.4 (%)    Platelets 154  150 - 400 (K/uL)   COMPREHENSIVE METABOLIC PANEL     Status: Abnormal   Collection Time   12/27/11  5:10 AM      Component Value Range Comment   Sodium 138  135 - 145 (mEq/L)    Potassium 3.3 (*) 3.5 - 5.1 (mEq/L) REPEATED TO VERIFY   Chloride 106  96 - 112 (mEq/L)    CO2 27  19 - 32 (mEq/L)    Glucose, Bld 84  70 - 99 (mg/dL)    BUN 5 (*) 6 - 23 (mg/dL)    Creatinine, Ser 6.96  0.50 - 1.10 (mg/dL)    Calcium 8.3 (*) 8.4 - 10.5 (mg/dL)    Total Protein 4.6 (*) 6.0 - 8.3 (g/dL)    Albumin 1.9 (*) 3.5 - 5.2 (g/dL)    AST 24  0 - 37 (U/L)    ALT 19  0 - 35 (U/L)    Alkaline Phosphatase 93  39 - 117 (U/L)    Total Bilirubin 0.4  0.3 - 1.2 (mg/dL)    GFR calc non Af Amer >90  >  90 (mL/min)    GFR calc Af Amer >90  >90 (mL/min)   URIC ACID     Status: Normal   Collection Time   12/27/11  5:10 AM      Component Value Range Comment   Uric Acid, Serum 4.6  2.4 - 7.0 (mg/dL)   [facility] MATERNAL DONOR DRAW     Status: Normal   Collection Time   12/27/11  5:15 AM      Component Value Range Comment   [facility] Maternal Donor Draw COLLECTED BY LABORATORY     CREATININE CLEARANCE, URINE, 24 HOUR     Status: Abnormal   Collection Time   12/27/11  9:20 AM      Component Value Range Comment   Urine Total Volume-CRCL 2625      Collection Interval-CRCL 24      Creatinine, Urine 80.42      Creatinine 0.88  0.50 - 1.10 (mg/dL)    Creatinine, 16X Ur 2111 (*) 700 - 1800 (mg/day)    Creatinine Clearance 167 (*) 75 - 115 (mL/min)     Assessment/Plan: Status post Cesarean section. Doing well postoperatively. BP's stable.  Hypokalemia--will increase K in diet at present.   24 hr urine pending  from this AM.  Lactating.   Continue current care. Support given r/e FOB's mother.   Brenda Lozano H 12/27/2011, 12:53 PM

## 2011-12-27 NOTE — Addendum Note (Signed)
Addendum  created 12/27/11 1610 by Truitt Leep, CRNA   Modules edited:Notes Section

## 2011-12-28 ENCOUNTER — Encounter (HOSPITAL_COMMUNITY): Payer: Self-pay | Admitting: *Deleted

## 2011-12-28 NOTE — Progress Notes (Signed)
Subjective: Postpartum Day 2Cesarean Delivery Patient reports tolerating PO, + flatus, + BM and no problems voiding, pain medication is working. Breastfeeding better today, uncertain regarding birth control  Objective: Vital signs in last 24 hours: Temp:  [97.8 F (36.6 C)-98.7 F (37.1 C)] 98.7 F (37.1 C) (02/06 0523) Pulse Rate:  [96-108] 96  (02/06 0523) Resp:  [18] 18  (02/06 0523) BP: (105-124)/(76-87) 120/76 mmHg (02/06 0523)  Physical Exam:  General: alert, cooperative and no distress Lochia: appropriate Uterine Fundus: firm Incision: healing well, no significant erythema, well approximated, no drainage DVT Evaluation: Negative Homan's sign. Lungs clear bilaterally, AP regular, bowel sounds active, softly distended.   Basename 12/27/11 0510 12/25/11 2254  HGB 11.0* 12.8  HCT 33.4* 37.8    Assessment/Plan: Status post Cesarean section. Doing well postoperatively.  Normal involution pp day 2 lactating Continue current care plan discharge tomorrow, birth control options reviewed.  Brenda Lozano 12/28/2011, 9:54 AM

## 2011-12-29 ENCOUNTER — Encounter (HOSPITAL_COMMUNITY): Payer: Self-pay | Admitting: Registered Nurse

## 2011-12-29 MED ORDER — IBUPROFEN 600 MG PO TABS: 600.0000 mg | ORAL_TABLET | Freq: Four times a day (QID) | ORAL | Status: AC | PRN

## 2011-12-29 MED ORDER — OXYCODONE-ACETAMINOPHEN 5-325 MG PO TABS: 1.0000 | ORAL_TABLET | ORAL | Status: AC | PRN

## 2011-12-29 NOTE — Discharge Summary (Signed)
Obstetric Discharge Summary Reason for Admission: rupture of membranes Prenatal Procedures: ultrasound Intrapartum Procedures: cesarean: low cervical, transverse Postpartum Procedures: none Complications-Operative and Postpartum: none Hemoglobin  Date Value Range Status  12/27/2011 11.0* 12.0-15.0 (g/dL) Final     HCT  Date Value Range Status  12/27/2011 33.4* 36.0-46.0 (%) Final    Discharge Diagnoses: Term Pregnancy-delivered  Discharge Information: Date: 12/29/2011 Activity: no lifting heavier than baby until pp visit Diet: routine Medications: PNV, Ibuprofen and Percocet Condition: stable Instructions: refer to practice specific booklet Discharge to: home Follow-up Information    Follow up with [facility] in 5 weeks.         Newborn Data: Live born female  Birth Weight: 8 lb 6.9 oz (3825 g) APGAR: 8, 9  A: c/s for FTP. Increased bp in labor resolved. Breastfeeding. Lungs clear. HR-RRR. Fundus firm 1 below, scant serous flow. P: plans Mirena or Nexplanon as BCM. Discussed and information booklets given to patient. Return to office x 5 weeks for PP visit-call to make appointment. Plan Mirena 6 weeks.   Kizzie Fantasia CORI 12/29/2011, 8:58 AM

## 2012-01-17 ENCOUNTER — Encounter: Payer: Self-pay | Admitting: Obstetrics and Gynecology

## 2012-01-30 ENCOUNTER — Ambulatory Visit (INDEPENDENT_AMBULATORY_CARE_PROVIDER_SITE_OTHER): Payer: Medicaid Other | Admitting: Obstetrics and Gynecology

## 2012-01-30 DIAGNOSIS — Z30431 Encounter for routine checking of intrauterine contraceptive device: Secondary | ICD-10-CM

## 2012-02-09 ENCOUNTER — Encounter (INDEPENDENT_AMBULATORY_CARE_PROVIDER_SITE_OTHER): Payer: Medicaid Other | Admitting: Registered Nurse

## 2012-02-09 DIAGNOSIS — Z3040 Encounter for surveillance of contraceptives, unspecified: Secondary | ICD-10-CM

## 2012-02-13 IMAGING — US US OB COMP LESS 14 WK
1 series · 14 of 28 positions shown · non-contrast
Comparison: None.

CLINICAL DATA: Abnormal vaginal bleeding.  Early pregnancy.

OBSTETRIC <14 WK US AND TRANSVAGINAL OB US
TECHNIQUE: Both transabdominal and transvaginal ultrasound
examinations were performed for complete evaluation of the
gestation as well as the maternal uterus, adnexal regions, and
pelvic cul-de-sac.  Transvaginal technique was performed to assess
early pregnancy.

[Series 1: us ob comp less 14 wks · 14 of 32 slices shown]
[im 2/32]
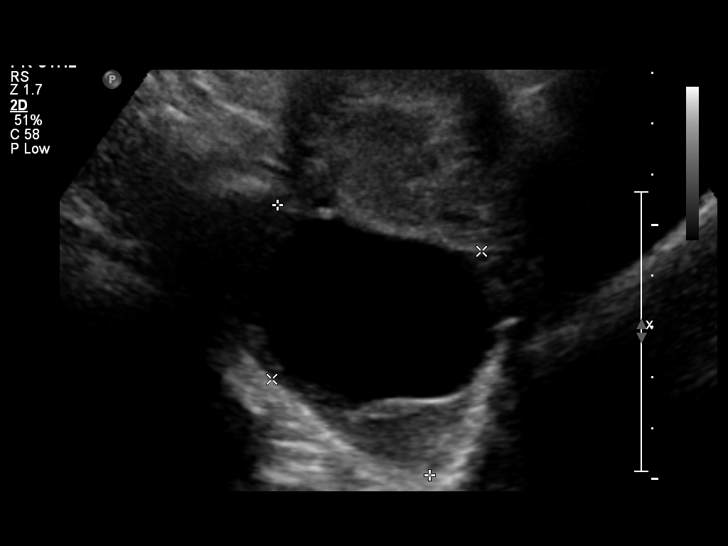
[im 4/32]
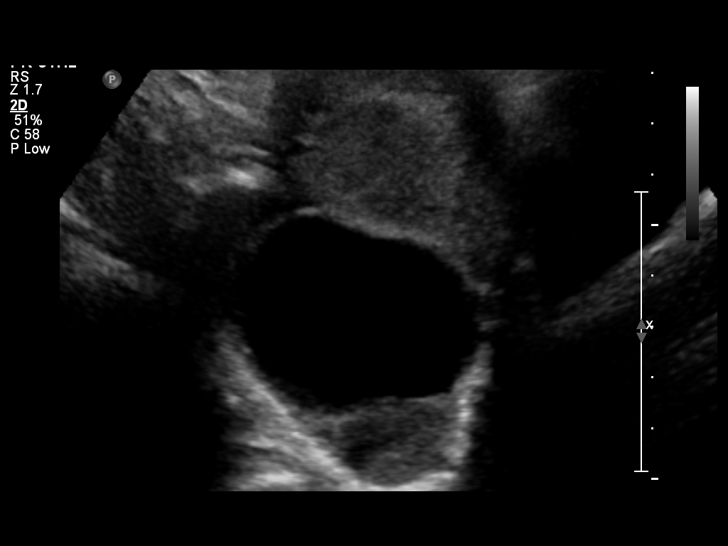
[im 6/32]
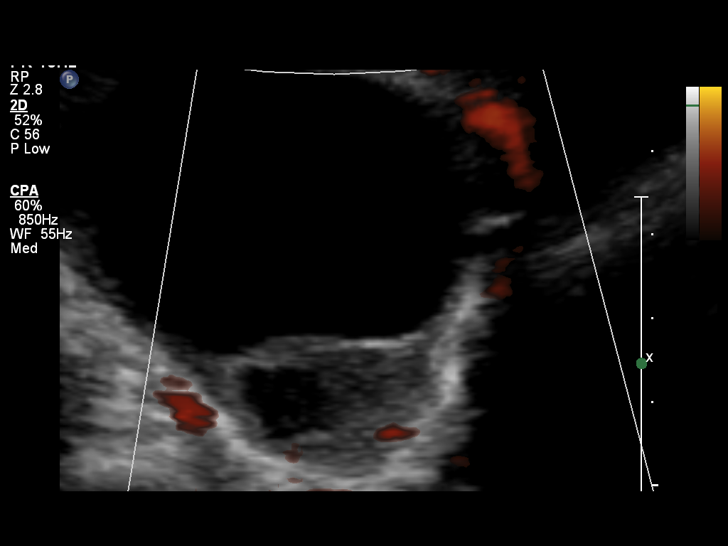
[im 9/32]
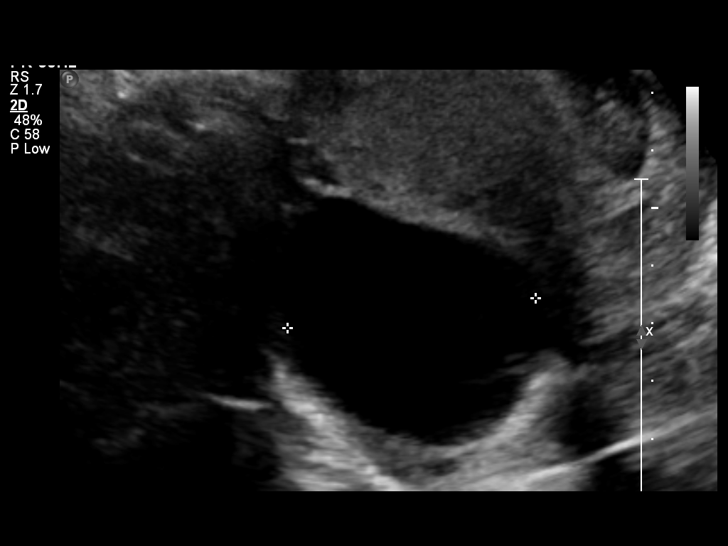
[im 11/32]
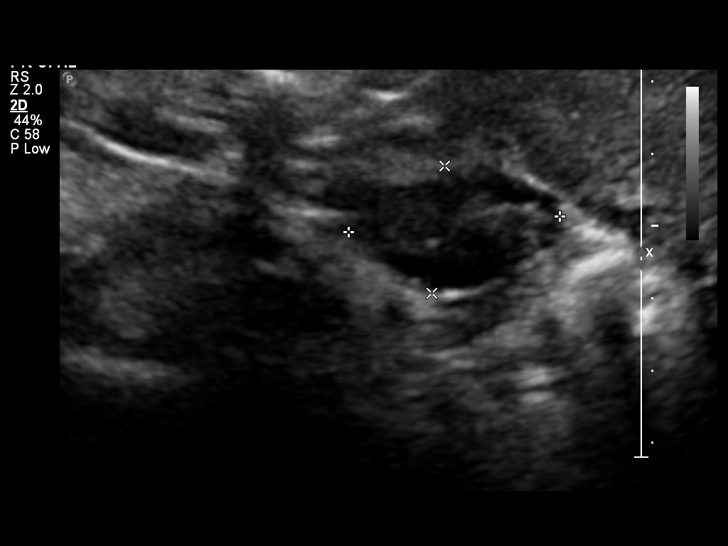
[im 13/32]
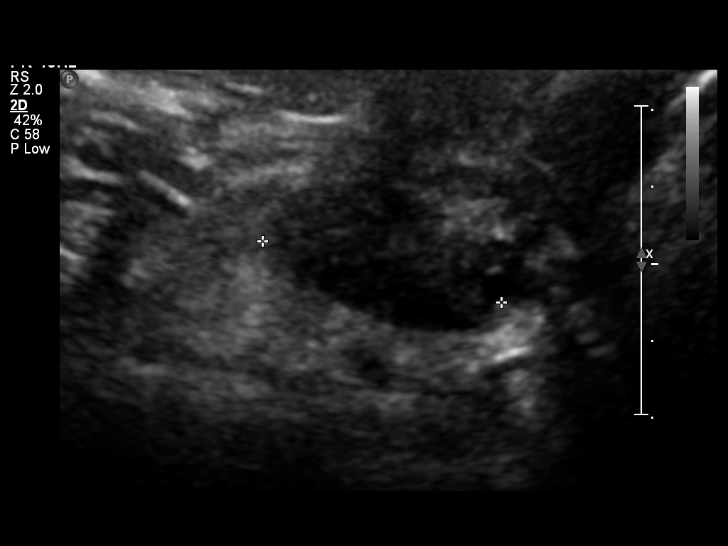
[im 15/32]
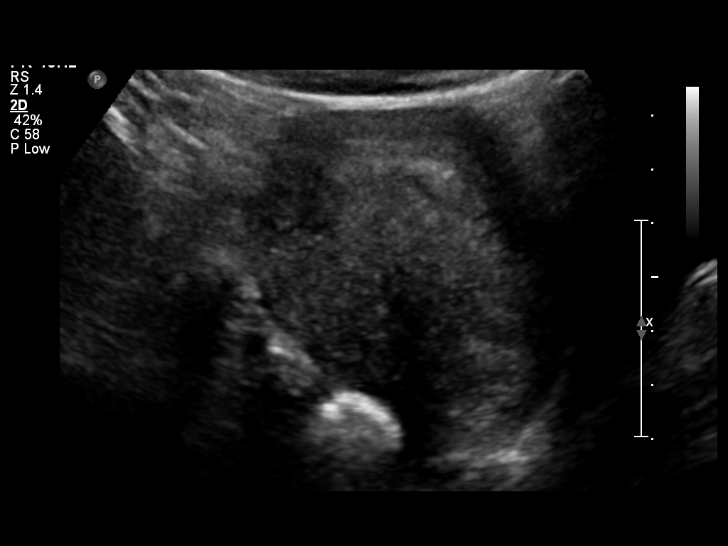
[im 18/32]
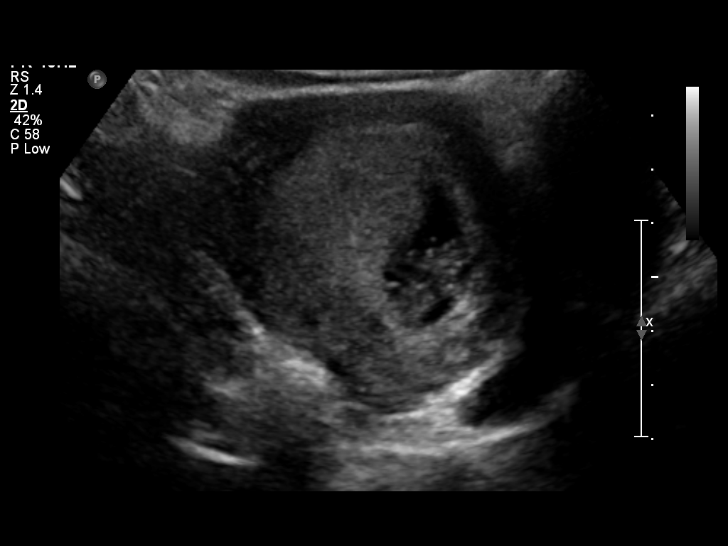
[im 20/32]
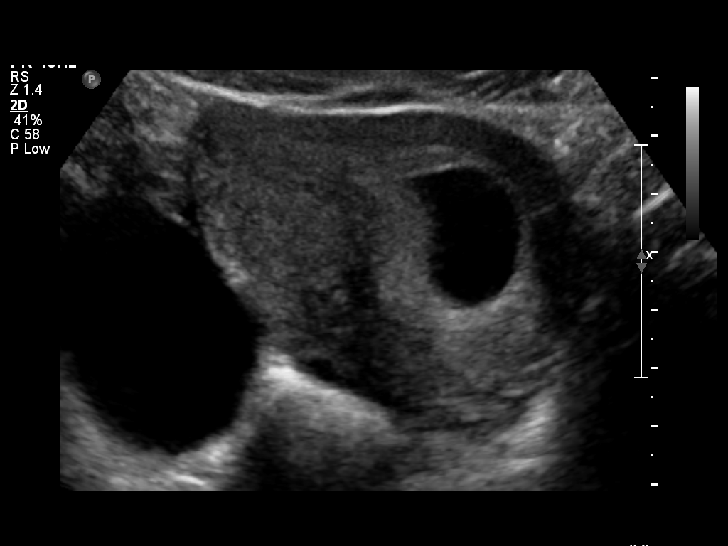
[im 22/32]
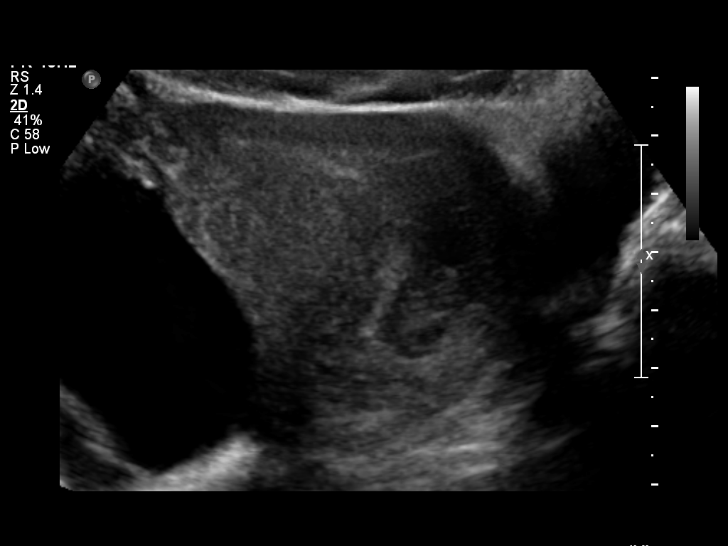
[im 25/32]
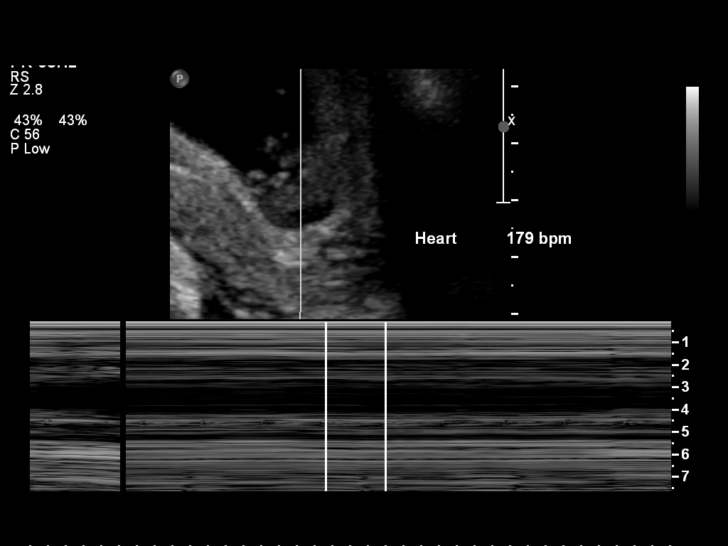
[im 27/32]
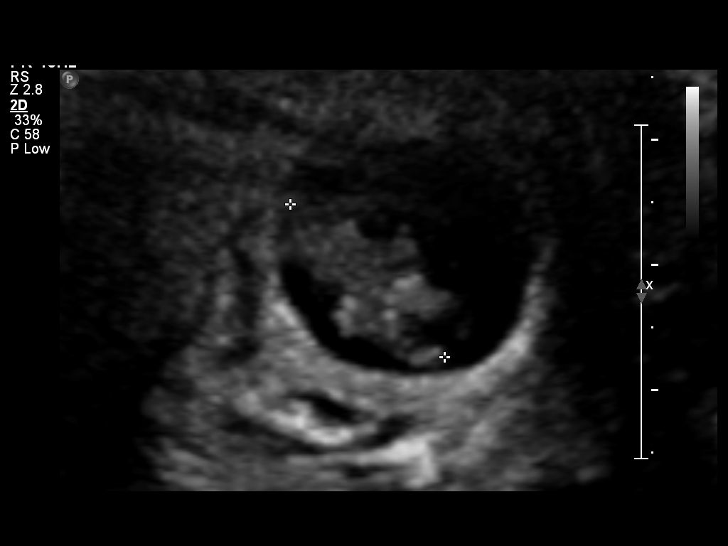
[im 29/32]
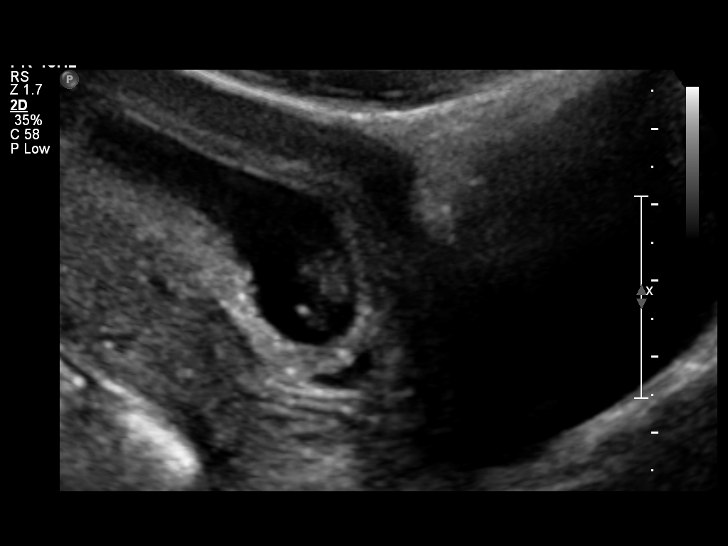
[im 32/32]
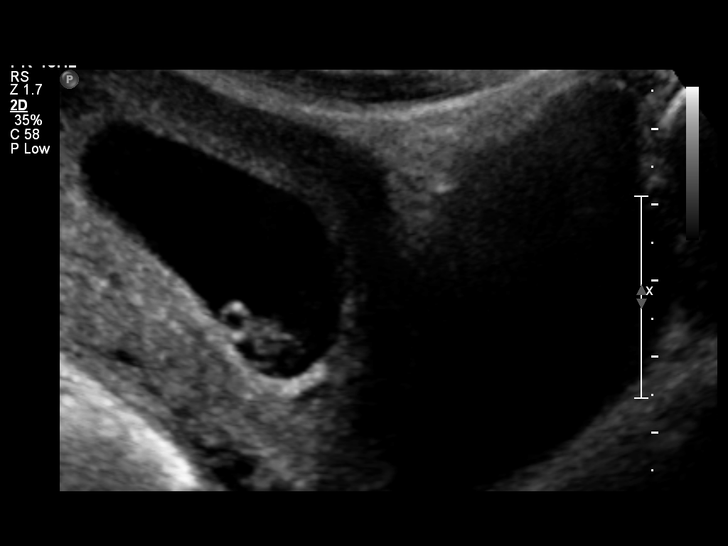

[14 of 28 positions shown; findings below may reference images not displayed]

Intrauterine gestational sac:  Visualized/normal in shape.
Yolk sac: Yes
Embryo: Yes
Cardiac Activity: Yes
Heart Rate: 179 bpm

CRL: 17.9   mm  eight   w  one   d        US EDC:  December 25, 2011

Maternal uterus/adnexae:
There is a small subchorionic hemorrhage.  There is a simple
appearing 4.3 cm cyst on the right ovary.  The left ovary is
normal.  No free fluid.
IMPRESSION: Intrauterine pregnancy of approximately 8 weeks 1 day gestation.
Small subchorionic hemorrhage.

4.3 cm simple cyst on the right ovary.

## 2012-02-24 ENCOUNTER — Encounter (INDEPENDENT_AMBULATORY_CARE_PROVIDER_SITE_OTHER): Payer: Medicaid Other

## 2012-02-24 ENCOUNTER — Encounter (INDEPENDENT_AMBULATORY_CARE_PROVIDER_SITE_OTHER): Payer: Medicaid Other | Admitting: Registered Nurse

## 2012-02-24 DIAGNOSIS — Z30431 Encounter for routine checking of intrauterine contraceptive device: Secondary | ICD-10-CM

## 2012-02-24 DIAGNOSIS — Z30430 Encounter for insertion of intrauterine contraceptive device: Secondary | ICD-10-CM

## 2012-02-24 DIAGNOSIS — N949 Unspecified condition associated with female genital organs and menstrual cycle: Secondary | ICD-10-CM

## 2012-02-24 DIAGNOSIS — Z3049 Encounter for surveillance of other contraceptives: Secondary | ICD-10-CM

## 2012-03-19 ENCOUNTER — Ambulatory Visit (INDEPENDENT_AMBULATORY_CARE_PROVIDER_SITE_OTHER): Payer: Medicaid Other | Admitting: Obstetrics and Gynecology

## 2012-03-19 ENCOUNTER — Encounter: Payer: Self-pay | Admitting: Obstetrics and Gynecology

## 2012-03-19 VITALS — BP 104/62 | Temp 98.9°F | Ht 63.5 in | Wt 180.0 lb

## 2012-03-19 DIAGNOSIS — E878 Other disorders of electrolyte and fluid balance, not elsewhere classified: Secondary | ICD-10-CM | POA: Insufficient documentation

## 2012-03-19 DIAGNOSIS — L0291 Cutaneous abscess, unspecified: Secondary | ICD-10-CM

## 2012-03-19 DIAGNOSIS — Z309 Encounter for contraceptive management, unspecified: Secondary | ICD-10-CM

## 2012-03-19 NOTE — Patient Instructions (Signed)
Give patient a note for work  Apply warm compresses or do warm soaks daily x 5 days for boil management

## 2012-03-19 NOTE — Progress Notes (Addendum)
Patient with IUD-Mirena insertion 02/24/2012 returns for f/u-no complaints.  Admits however to a boil inside of left thigh x several days.  Denies drainage, fever or pointing.  Has a h/o the same.  O:  EGBUS: wnl;  vagina: scant pink d/c;  cervix: strings visible; uterus: NSSC, non-tender; adnexae: non-tender   Medial left thigh adjacent to crural fold with 1 cm x 1cm firm, mildly tender nodule without erythema or pointing  A:  IUD F/U      Left Thigh Abscess  P: Warm compresses or soaks      Call for I & D if abscess doesn't resolve     perineal hygiene

## 2012-04-03 ENCOUNTER — Telehealth: Payer: Self-pay | Admitting: Obstetrics and Gynecology

## 2012-04-03 NOTE — Telephone Encounter (Signed)
Triage/epic 

## 2012-04-03 NOTE — Telephone Encounter (Signed)
Pt called today complaining of previous boil not gone away. Left message to return call. Brenda Lozano

## 2012-09-11 ENCOUNTER — Telehealth: Payer: Self-pay | Admitting: Obstetrics and Gynecology

## 2012-09-11 NOTE — Telephone Encounter (Signed)
Tc to pt regarding msg, lm on vm to call back. 
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
## 2013-12-05 ENCOUNTER — Ambulatory Visit (INDEPENDENT_AMBULATORY_CARE_PROVIDER_SITE_OTHER): Payer: 59 | Admitting: Family Medicine

## 2013-12-05 VITALS — BP 120/68 | HR 106 | Temp 98.6°F | Resp 16 | Ht 64.0 in | Wt 220.6 lb

## 2013-12-05 DIAGNOSIS — R05 Cough: Secondary | ICD-10-CM

## 2013-12-05 DIAGNOSIS — R609 Edema, unspecified: Secondary | ICD-10-CM

## 2013-12-05 DIAGNOSIS — R059 Cough, unspecified: Secondary | ICD-10-CM

## 2013-12-05 DIAGNOSIS — B349 Viral infection, unspecified: Secondary | ICD-10-CM

## 2013-12-05 MED ORDER — HYDROCODONE-HOMATROPINE 5-1.5 MG/5ML PO SYRP: 5.0000 mL | ORAL_SOLUTION | ORAL | Status: DC | PRN

## 2013-12-05 MED ORDER — AZITHROMYCIN 250 MG PO TABS: ORAL_TABLET | ORAL | Status: DC

## 2013-12-05 MED ORDER — IPRATROPIUM BROMIDE 0.03 % NA SOLN: 2.0000 | Freq: Two times a day (BID) | NASAL | Status: DC

## 2013-12-05 NOTE — Patient Instructions (Signed)
Drink plenty of fluids  Get enough rest  Stay off work through tomorrow  Use the Atrovent nasal spray 2 sprays each nostril 3 times daily  Use the cough syrup 1 teaspoon every 4-6 hours as needed for cough   Take an over-the-counter antihistamine decongestant such as Claritin-D or Allegra-D or Zyrtec-D  If you are not improving substantially over the next 3 days, then go ahead and take the antibiotic. I believe this is all viral and should be gradually starting to run its course. Don't get the medicine filled and less you are getting worse or not improving at all

## 2013-12-05 NOTE — Addendum Note (Signed)
Addended by: Maryann AlarBURNS, Shernell Saldierna M on: 12/05/2013 08:00 PM   Modules accepted: Level of Service

## 2013-12-05 NOTE — Progress Notes (Signed)
Subjective

## 2017-04-26 LAB — OB RESULTS CONSOLE RUBELLA ANTIBODY, IGM: RUBELLA: IMMUNE

## 2017-04-26 LAB — OB RESULTS CONSOLE RPR: RPR: NONREACTIVE

## 2017-04-26 LAB — OB RESULTS CONSOLE ABO/RH: RH Type: POSITIVE

## 2017-04-26 LAB — OB RESULTS CONSOLE ANTIBODY SCREEN: Antibody Screen: NEGATIVE

## 2017-04-26 LAB — OB RESULTS CONSOLE GC/CHLAMYDIA
Chlamydia: NEGATIVE
GC PROBE AMP, GENITAL: NEGATIVE

## 2017-04-26 LAB — OB RESULTS CONSOLE HEPATITIS B SURFACE ANTIGEN: HEP B S AG: NEGATIVE

## 2017-04-26 LAB — OB RESULTS CONSOLE HIV ANTIBODY (ROUTINE TESTING): HIV: NONREACTIVE

## 2017-05-20 ENCOUNTER — Encounter (HOSPITAL_COMMUNITY): Payer: Self-pay | Admitting: *Deleted

## 2017-05-20 ENCOUNTER — Inpatient Hospital Stay (HOSPITAL_COMMUNITY): Payer: Federal, State, Local not specified - PPO

## 2017-05-20 ENCOUNTER — Inpatient Hospital Stay (HOSPITAL_COMMUNITY)
Admission: AD | Admit: 2017-05-20 | Discharge: 2017-05-21 | Disposition: A | Discharge status: Home / Self Care | Payer: Federal, State, Local not specified - PPO | Source: Ambulatory Visit | Attending: Obstetrics and Gynecology | Admitting: Obstetrics and Gynecology

## 2017-05-20 DIAGNOSIS — Z3A01 Less than 8 weeks gestation of pregnancy: Secondary | ICD-10-CM | POA: Insufficient documentation

## 2017-05-20 DIAGNOSIS — O99210 Obesity complicating pregnancy, unspecified trimester: Secondary | ICD-10-CM

## 2017-05-20 DIAGNOSIS — O26851 Spotting complicating pregnancy, first trimester: Secondary | ICD-10-CM | POA: Diagnosis present

## 2017-05-20 DIAGNOSIS — R102 Pelvic and perineal pain: Secondary | ICD-10-CM

## 2017-05-20 DIAGNOSIS — O23591 Infection of other part of genital tract in pregnancy, first trimester: Secondary | ICD-10-CM | POA: Insufficient documentation

## 2017-05-20 DIAGNOSIS — Z91013 Allergy to seafood: Secondary | ICD-10-CM

## 2017-05-20 DIAGNOSIS — Z8619 Personal history of other infectious and parasitic diseases: Secondary | ICD-10-CM

## 2017-05-20 DIAGNOSIS — O26891 Other specified pregnancy related conditions, first trimester: Secondary | ICD-10-CM

## 2017-05-20 DIAGNOSIS — Z91048 Other nonmedicinal substance allergy status: Secondary | ICD-10-CM

## 2017-05-20 DIAGNOSIS — Z98891 History of uterine scar from previous surgery: Secondary | ICD-10-CM

## 2017-05-20 DIAGNOSIS — F419 Anxiety disorder, unspecified: Secondary | ICD-10-CM

## 2017-05-20 DIAGNOSIS — Z283 Underimmunization status: Secondary | ICD-10-CM

## 2017-05-20 DIAGNOSIS — O09899 Supervision of other high risk pregnancies, unspecified trimester: Secondary | ICD-10-CM

## 2017-05-20 DIAGNOSIS — N949 Unspecified condition associated with female genital organs and menstrual cycle: Secondary | ICD-10-CM

## 2017-05-20 DIAGNOSIS — O99340 Other mental disorders complicating pregnancy, unspecified trimester: Secondary | ICD-10-CM

## 2017-05-20 DIAGNOSIS — Z91018 Allergy to other foods: Secondary | ICD-10-CM

## 2017-05-20 DIAGNOSIS — Z2839 Other underimmunization status: Secondary | ICD-10-CM

## 2017-05-20 LAB — URINALYSIS, ROUTINE W REFLEX MICROSCOPIC
Bilirubin Urine: NEGATIVE
GLUCOSE, UA: NEGATIVE mg/dL
Ketones, ur: NEGATIVE mg/dL
Leukocytes, UA: NEGATIVE
NITRITE: NEGATIVE
PH: 6 (ref 5.0–8.0)
Protein, ur: NEGATIVE mg/dL
Specific Gravity, Urine: 1.006 (ref 1.005–1.030)

## 2017-05-20 NOTE — Progress Notes (Signed)
Sherre ScarletKimberly Williams CNM on unit and aware of pt's admission and status.

## 2017-05-20 NOTE — MAU Note (Signed)
Had pink/red spotting earlier today on two different occasions. Also haing some pelvic pressure.

## 2017-05-20 NOTE — MAU Provider Note (Signed)
History  Brenda Lozano is a 31 yo G3P1011 @ 9.2 wks who presents to MAU after calling w/ c/o vaginal pressure and spotting. States spotted x 2 at work, ranging from pink to "bright" red. Denies abdominal or back pain, painful urination, foul smelling urine, burning on urination, fever, vomiting, diarrhea, hx of kidney stones or UTI. Last intercourse 1 wk ago.  Patient Active Problem List   Diagnosis Date Noted  . Spotting affecting pregnancy in first trimester 05/20/2017  . Pelvic pressure in pregnancy, antepartum, first trimester 05/20/2017  . History of C-section 05/20/2017  . Obesity complicating pregnancy 05/20/2017  . 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 Maternal varicella, non-immune 05/20/2017  . Anxiety disorder affecting pregnancy, antepartum 05/20/2017  . History of infection due to human papilloma virus (HPV) 05/20/2017  . Hyperchloremia 03/19/2012    Chief Complaint  Patient presents with  . pelvic pressure  . Vaginal Bleeding   HPI  As above  OB History    Gravida Para Term Preterm AB Living   2 1 1  0 1 1   SAB TAB Ectopic Multiple Live Births   0 0 0 0 1      Past Medical History:  Diagnosis Date  . Hyperchloremia    H/C  . No pertinent past medical history     Past Surgical History:  Procedure Laterality Date  . CESAREAN SECTION  12/26/2011   Procedure: CESAREAN SECTION;  Surgeon: Esmeralda ArthurSandra A Rivard, MD;  Location: [facility];  Service: Gynecology;  Laterality: N/A;  . WISDOM TOOTH EXTRACTION      Family History  Problem Relation Age of Onset  . Asthma Mother   . Depression Mother   . Hypertension Mother   . Mental illness Mother   . Thyroid disease Mother   . Hypertension Maternal Grandmother   . Diabetes Maternal Grandmother   . Cancer Maternal Grandmother     Social History  Substance Use Topics  . Smoking status: Never Smoker  . Smokeless tobacco: Never  Used  . Alcohol use 1.0 oz/week    2 drink(s) per week     Comment: glass of wine sometimes    Allergies:  Allergies  Allergen Reactions  . Fish-Derived Products     Patient is allergic to catfish and porta bella mushrooms  . Latex Itching  . Tape Hives and Itching    PT IS ALLERGIC TO ADHESIVE TAPE    Prescriptions Prior to Admission  Medication Sig Dispense Refill Last Dose  . azithromycin (ZITHROMAX) 250 MG tablet Take 2 initially then one daily for 4 days. 6 tablet 0   . HYDROcodone-homatropine (HYCODAN) 5-1.5 MG/5ML syrup Take 5 mLs by mouth every 4 (four) hours as needed for cough. 120 mL 0   . ipratropium (ATROVENT) 0.03 % nasal spray Place 2 sprays into the nose 2 (two) times daily. 30 mL 0   . Multiple Vitamin (DAILY VITAMINS PO) Take by mouth. PT TAKES RASPBERRY KETONE   Taking  . Prenatal Vit-Fe Fumarate-FA (PRENATAL MULTIVITAMIN) TABS Take 1 tablet by mouth daily.   Taking    ROS  Spotting Pelvic pressure Physical Exam   Blood pressure 132/78, pulse 80, temperature 98.3 F (36.8 C), resp. rate 18, height 5\' 4"  (1.626 m), weight 107.5 kg (237 lb).    Physical Exam  Gen: NAD. Abdomen: soft, NT, no rebound or guarding. External genitalia: No external erythema, edema or excoriation.  Speculum: Dark tan-green malodorous discharge noted. Wet prep obtained. No active bleeding from cervix.   Results for orders placed or performed during the hospital encounter of 05/20/17 (from the past 48 hour(s))  Urinalysis, Routine w reflex microscopic     Status: Abnormal   Collection Time: 05/20/17 11:15 PM  Result Value Ref Range   Color, Urine STRAW (A) YELLOW   APPearance CLEAR CLEAR   Specific Gravity, Urine 1.006 1.005 - 1.030   pH 6.0 5.0 - 8.0   Glucose, UA NEGATIVE NEGATIVE mg/dL   Hgb urine dipstick LARGE (A) NEGATIVE   Bilirubin Urine NEGATIVE NEGATIVE   Ketones, ur NEGATIVE NEGATIVE mg/dL   Protein, ur NEGATIVE NEGATIVE mg/dL   Nitrite NEGATIVE NEGATIVE    Leukocytes, UA NEGATIVE NEGATIVE   RBC / HPF 0-5 0 - 5 RBC/hpf   WBC, UA 0-5 0 - 5 WBC/hpf   Bacteria, UA RARE (A) NONE SEEN   Squamous Epithelial / LPF 6-30 (A) NONE SEEN   Mucous PRESENT   OB Urine Culture     Status: None (Preliminary result)   Collection Time: 05/20/17 11:15 PM  Result Value Ref Range   Specimen Description OB CLEAN CATCH    Special Requests NONE    Culture      CULTURE REINCUBATED FOR BETTER GROWTH Performed at [facility], [address], [place], [place] [address]    Report Status PENDING   Wet prep, genital     Status: Abnormal   Collection Time: 05/21/17  1:05 AM  Result Value Ref Range   Yeast Wet Prep HPF POC NONE SEEN NONE SEEN   Trich, Wet Prep NONE SEEN NONE SEEN   Clue Cells Wet Prep HPF POC PRESENT (A) NONE SEEN   WBC, Wet Prep HPF POC FEW (A) NONE SEEN    Comment: FEW BACTERIA SEEN   Sperm NONE SEEN    Study Result   CLINICAL DATA:  Spotting with pelvic pressure  EXAM: OBSTETRIC <14 WK [place] AND TRANSVAGINAL OB US  TECHNIQUE: Both transabdominal and transvaginal ultrasound examinations were performed for complete evaluation of the gestation as well as the maternal uterus, adnexal regions, and pelvic cul-de-sac. Transvaginal technique was performed to assess early pregnancy.  COMPARISON:  None.  FINDINGS: Intrauterine gestational sac: Single intrauterine gestation  Yolk sac:  Visualized  Embryo:  Visualized  Cardiac Activity: Visualized  Heart Rate: 173  bpm  CRL:  18.3  mm   8 w   to d                  [place] EDC: 12/28/2017  Subchorionic hemorrhage: Small subchorionic hemorrhage along the right aspect of sac on trans abdominal images an at the lower uterine segment on endovaginal images.  Maternal uterus/adnexae: The right ovary measures 4 x 2.2 x 2.4 cm. The left ovary is not visualized. No free fluid.  IMPRESSION: Single viable intrauterine pregnancy as above. Small  subchorionic hemorrhage.   Electronically Signed   By: Jasmine Pang M.D.   On: 05/21/2017 00:15       ED Course  UA Urine culture U/S Wet prep  Assessment: SIUP. FHR 173 bpm. Small SCH. EDD 1 wk off from LMP EDD. BV. No concerns for UTI at this time.  Plan: Advised that due date will be confirmed at office visit. Discussed SCH in detail - strict precautions reviewed to include pelvic rest, no heavy lifting or strenuous exercise. Advised no traveling until after next u/s and clearance from  provider (wanted to leave town for the holiday). Metronidazole 500 mg po bid x 7 days. OOW today. Increase hydration and fiber in diet - avoid straining at stool. Recommended rest. Multiple questions answered. Office f/u as scheduled.   Sherre Scarlet CNM, MS 05/21/17, 01:50 AM

## 2017-05-21 LAB — WET PREP, GENITAL
Sperm: NONE SEEN
TRICH WET PREP: NONE SEEN
YEAST WET PREP: NONE SEEN

## 2017-05-21 MED ORDER — METRONIDAZOLE 500 MG PO TABS: 500.0000 mg | ORAL_TABLET | Freq: Two times a day (BID) | ORAL | 0 refills | Status: AC

## 2017-05-21 NOTE — Discharge Instructions (Signed)
Subchorionic Hematoma °A subchorionic hematoma is a gathering of blood between the outer wall of the placenta and the inner wall of the womb (uterus). The placenta is the organ that connects the fetus to the wall of the uterus. The placenta performs the feeding, breathing (oxygen to the fetus), and waste removal (excretory work) of the fetus. °Subchorionic hematoma is the most common abnormality found on a result from ultrasonography done during the first trimester or early second trimester of pregnancy. If there has been little or no vaginal bleeding, early small hematomas usually shrink on their own and do not affect your baby or pregnancy. The blood is gradually absorbed over 1-2 weeks. When bleeding starts later in pregnancy or the hematoma is larger or occurs in an older pregnant woman, the outcome may not be as good. Larger hematomas may get bigger, which increases the chances for miscarriage. Subchorionic hematoma also increases the risk of premature detachment of the placenta from the uterus, preterm (premature) labor, and stillbirth. °Follow these instructions at home: °· Stay on bed rest if your health care provider recommends this. Although bed rest will not prevent more bleeding or prevent a miscarriage, your health care provider may recommend bed rest until you are advised otherwise. °· Avoid heavy lifting (more than 10 lb [4.5 kg]), exercise, sexual intercourse, or douching as directed by your health care provider. °· Keep track of the number of pads you use each day and how soaked (saturated) they are. Write down this information. °· Do not use tampons. °· Keep all follow-up appointments as directed by your health care provider. Your health care provider may ask you to have follow-up blood tests or ultrasound tests or both. °Get help right away if: °· You have severe cramps in your stomach, back, abdomen, or pelvis. °· You have a fever. °· You pass large clots or tissue. Save any tissue for your  health care provider to look at. °· Your bleeding increases or you become lightheaded, feel weak, or have fainting episodes. °This information is not intended to replace advice given to you by your health care provider. Make sure you discuss any questions you have with your health care provider. °Document Released: 02/22/2007 Document Revised: 04/14/2016 Document Reviewed: 06/06/2013 °Elsevier Interactive Patient Education © 2017 Elsevier Inc. ° °

## 2017-05-21 NOTE — Progress Notes (Signed)
Kimberly Williams CNM in earlier to discuss test results and d/c plan. Written and verbal d/c instructions given and understanding voiced. 

## 2017-05-23 LAB — CULTURE, OB URINE

## 2017-11-17 ENCOUNTER — Other Ambulatory Visit: Payer: Self-pay | Admitting: Obstetrics and Gynecology

## 2017-11-17 ENCOUNTER — Encounter (HOSPITAL_COMMUNITY): Payer: Self-pay

## 2017-11-17 ENCOUNTER — Encounter (HOSPITAL_COMMUNITY)
Admission: RE | Admit: 2017-11-17 | Discharge: 2017-11-17 | Disposition: A | Discharge status: Home / Self Care | Payer: Federal, State, Local not specified - PPO | Source: Ambulatory Visit

## 2017-11-17 HISTORY — DX: Anxiety disorder, unspecified: F41.9

## 2017-12-07 ENCOUNTER — Telehealth (HOSPITAL_COMMUNITY): Payer: Self-pay | Admitting: *Deleted

## 2017-12-07 NOTE — Telephone Encounter (Signed)
Preadmission screen  

## 2017-12-08 ENCOUNTER — Telehealth (HOSPITAL_COMMUNITY): Payer: Self-pay | Admitting: *Deleted

## 2017-12-08 NOTE — Telephone Encounter (Signed)
Preadmission screen  

## 2017-12-11 ENCOUNTER — Telehealth (HOSPITAL_COMMUNITY): Payer: Self-pay | Admitting: *Deleted

## 2017-12-11 NOTE — Telephone Encounter (Signed)
Preadmission screen  

## 2017-12-12 ENCOUNTER — Encounter (HOSPITAL_COMMUNITY): Payer: Self-pay

## 2017-12-20 NOTE — Patient Instructions (Signed)
Brenda Lozano  12/20/2017   Your procedure is scheduled on:  12/22/2017  Enter through the [facility]tr[facility] of [facility] at 1130 AM.  Pick up the phone at the desk and dial [phone]  Call this number if you have problems the morning of surgery:[phone]  Remember:   Do not eat food:(After Midnight) Desps de medianoche.  Do not drink clear liquids: (After Midnight) Desps de medianoche.  Take these medicines the morning of surgery with A SIP OF WATER: none   Do not wear jewelry, make-up or nail polish.  Do not wear lotions, powders, or perfumes. Do not wear deodorant.  Do not shave 48 hours prior to surgery.  Do not bring valuables to the hospital.  [facility] is not   responsible for any belongings or valuables brought to the hospital.  Contacts, dentures or bridgework may not be worn into surgery.  Leave suitcase in the car. After surgery it may be brought to your room.  For patients admitted to the hospital, checkout time is 11:00 AM the day of              discharge.    N/A   Please read over the following fact sheets that you were given:   Surgical Site Infection Prevention

## 2017-12-21 ENCOUNTER — Encounter (HOSPITAL_COMMUNITY)
Admission: RE | Admit: 2017-12-21 | Discharge: 2017-12-21 | Disposition: A | Discharge status: Home / Self Care | Payer: Federal, State, Local not specified - PPO | Source: Ambulatory Visit | Attending: Obstetrics and Gynecology | Admitting: Obstetrics and Gynecology

## 2017-12-21 LAB — CBC
HCT: 40.9 % (ref 36.0–46.0)
Hemoglobin: 13.9 g/dL (ref 12.0–15.0)
MCH: 28.1 pg (ref 26.0–34.0)
MCHC: 34 g/dL (ref 30.0–36.0)
MCV: 82.6 fL (ref 78.0–100.0)
PLATELETS: 211 10*3/uL (ref 150–400)
RBC: 4.95 MIL/uL (ref 3.87–5.11)
RDW: 15.7 % — ABNORMAL HIGH (ref 11.5–15.5)
WBC: 12.4 10*3/uL — AB (ref 4.0–10.5)

## 2017-12-21 LAB — TYPE AND SCREEN
ABO/RH(D): B POS
ANTIBODY SCREEN: NEGATIVE

## 2017-12-22 ENCOUNTER — Inpatient Hospital Stay (HOSPITAL_COMMUNITY): Payer: Federal, State, Local not specified - PPO | Admitting: Anesthesiology

## 2017-12-22 ENCOUNTER — Inpatient Hospital Stay (HOSPITAL_COMMUNITY)
Admission: AD | Admit: 2017-12-22 | Discharge: 2017-12-25 | DRG: 788 | Disposition: A | Discharge status: Home / Self Care | Payer: Federal, State, Local not specified - PPO | Source: Ambulatory Visit | Attending: Obstetrics and Gynecology | Admitting: Obstetrics and Gynecology

## 2017-12-22 ENCOUNTER — Encounter (HOSPITAL_COMMUNITY): Payer: Self-pay | Admitting: *Deleted

## 2017-12-22 ENCOUNTER — Encounter (HOSPITAL_COMMUNITY)
Admission: AD | Disposition: A | Discharge status: Home / Self Care | Payer: Self-pay | Source: Ambulatory Visit | Attending: Obstetrics and Gynecology

## 2017-12-22 DIAGNOSIS — Z3490 Encounter for supervision of normal pregnancy, unspecified, unspecified trimester: Secondary | ICD-10-CM

## 2017-12-22 DIAGNOSIS — Z3A39 39 weeks gestation of pregnancy: Secondary | ICD-10-CM

## 2017-12-22 DIAGNOSIS — R599 Enlarged lymph nodes, unspecified: Secondary | ICD-10-CM | POA: Diagnosis present

## 2017-12-22 DIAGNOSIS — O34211 Maternal care for low transverse scar from previous cesarean delivery: Secondary | ICD-10-CM | POA: Diagnosis present

## 2017-12-22 DIAGNOSIS — Z98891 History of uterine scar from previous surgery: Secondary | ICD-10-CM

## 2017-12-22 DIAGNOSIS — O99214 Obesity complicating childbirth: Secondary | ICD-10-CM | POA: Diagnosis present

## 2017-12-22 LAB — CBC
HEMATOCRIT: 40.5 % (ref 36.0–46.0)
HEMOGLOBIN: 13.6 g/dL (ref 12.0–15.0)
MCH: 27.9 pg (ref 26.0–34.0)
MCHC: 33.6 g/dL (ref 30.0–36.0)
MCV: 83 fL (ref 78.0–100.0)
Platelets: 206 10*3/uL (ref 150–400)
RBC: 4.88 MIL/uL (ref 3.87–5.11)
RDW: 15.7 % — AB (ref 11.5–15.5)
WBC: 14 10*3/uL — ABNORMAL HIGH (ref 4.0–10.5)

## 2017-12-22 LAB — CREATININE, SERUM
Creatinine, Ser: 0.75 mg/dL (ref 0.44–1.00)
GFR calc Af Amer: >60 mL/min (ref >60)

## 2017-12-22 LAB — RPR: RPR: NONREACTIVE

## 2017-12-22 SURGERY — Surgical Case
Anesthesia: Spinal | Site: Abdomen | Wound class: Clean Contaminated

## 2017-12-22 MED ORDER — DEXAMETHASONE SODIUM PHOSPHATE 10 MG/ML IJ SOLN
INTRAMUSCULAR | Status: AC
  Filled 2017-12-22: qty 1

## 2017-12-22 MED ORDER — ONDANSETRON HCL 4 MG/2ML IJ SOLN
4.0000 mg | Freq: Three times a day (TID) | INTRAMUSCULAR | Status: DC | PRN
  Administered 2017-12-22: 4 mg via INTRAVENOUS

## 2017-12-22 MED ORDER — SIMETHICONE 80 MG PO CHEW
80.0000 mg | CHEWABLE_TABLET | ORAL | Status: DC | PRN
Start: 2017-12-22 — End: 2017-12-25

## 2017-12-22 MED ORDER — METHYLERGONOVINE MALEATE 0.2 MG/ML IJ SOLN: 0.2000 mg | INTRAMUSCULAR | Status: DC | PRN

## 2017-12-22 MED ORDER — OXYCODONE-ACETAMINOPHEN 5-325 MG PO TABS
2.0000 | ORAL_TABLET | ORAL | Status: DC | PRN
  Administered 2017-12-23 – 2017-12-25 (×7): 2 via ORAL
  Filled 2017-12-22 (×7): qty 2

## 2017-12-22 MED ORDER — KETOROLAC TROMETHAMINE 30 MG/ML IJ SOLN
30.0000 mg | Freq: Four times a day (QID) | INTRAMUSCULAR | Status: AC | PRN
  Administered 2017-12-22: 30 mg via INTRAMUSCULAR

## 2017-12-22 MED ORDER — WITCH HAZEL-GLYCERIN EX PADS: 1.0000 "application " | MEDICATED_PAD | CUTANEOUS | Status: DC | PRN

## 2017-12-22 MED ORDER — NALOXONE HCL 0.4 MG/ML IJ SOLN: 0.4000 mg | INTRAMUSCULAR | Status: DC | PRN

## 2017-12-22 MED ORDER — METOCLOPRAMIDE HCL 5 MG/ML IJ SOLN
INTRAMUSCULAR | Status: DC | PRN
  Administered 2017-12-22: 10 mg via INTRAVENOUS

## 2017-12-22 MED ORDER — BUPIVACAINE HCL (PF) 0.25 % IJ SOLN
INTRAMUSCULAR | Status: AC
  Filled 2017-12-22: qty 30

## 2017-12-22 MED ORDER — PHENYLEPHRINE 40 MCG/ML (10ML) SYRINGE FOR IV PUSH (FOR BLOOD PRESSURE SUPPORT)
PREFILLED_SYRINGE | INTRAVENOUS | Status: AC
  Filled 2017-12-22: qty 10

## 2017-12-22 MED ORDER — SIMETHICONE 80 MG PO CHEW
80.0000 mg | CHEWABLE_TABLET | Freq: Three times a day (TID) | ORAL | Status: DC
  Administered 2017-12-23 – 2017-12-25 (×5): 80 mg via ORAL
  Filled 2017-12-22 (×5): qty 1

## 2017-12-22 MED ORDER — CEFAZOLIN SODIUM-DEXTROSE 2-4 GM/100ML-% IV SOLN
2.0000 g | INTRAVENOUS | Status: AC
  Administered 2017-12-22: 2 g via INTRAVENOUS

## 2017-12-22 MED ORDER — DIBUCAINE 1 % RE OINT: 1.0000 "application " | TOPICAL_OINTMENT | RECTAL | Status: DC | PRN

## 2017-12-22 MED ORDER — STERILE WATER FOR IRRIGATION IR SOLN
Status: DC | PRN
  Administered 2017-12-22: 1000 mL

## 2017-12-22 MED ORDER — NALOXONE HCL 4 MG/10ML IJ SOLN: 1.0000 ug/kg/h | INTRAVENOUS | Status: DC | PRN

## 2017-12-22 MED ORDER — ENOXAPARIN SODIUM 40 MG/0.4ML ~~LOC~~ SOLN
40.0000 mg | SUBCUTANEOUS | Status: DC
  Administered 2017-12-23 – 2017-12-25 (×3): 40 mg via SUBCUTANEOUS
  Filled 2017-12-22 (×4): qty 0.4

## 2017-12-22 MED ORDER — DEXAMETHASONE SODIUM PHOSPHATE 10 MG/ML IJ SOLN
INTRAMUSCULAR | Status: DC | PRN
  Administered 2017-12-22: 10 mg via INTRAVENOUS

## 2017-12-22 MED ORDER — ONDANSETRON HCL 4 MG/2ML IJ SOLN
INTRAMUSCULAR | Status: AC
  Filled 2017-12-22: qty 2

## 2017-12-22 MED ORDER — KETOROLAC TROMETHAMINE 30 MG/ML IJ SOLN
INTRAMUSCULAR | Status: AC
  Filled 2017-12-22: qty 1

## 2017-12-22 MED ORDER — METOCLOPRAMIDE HCL 5 MG/ML IJ SOLN
INTRAMUSCULAR | Status: AC
  Filled 2017-12-22: qty 2

## 2017-12-22 MED ORDER — MORPHINE SULFATE (PF) 0.5 MG/ML IJ SOLN
INTRAMUSCULAR | Status: AC
  Filled 2017-12-22: qty 10

## 2017-12-22 MED ORDER — GUAIFENESIN 100 MG/5ML PO SOLN
5.0000 mL | ORAL | Status: DC | PRN
  Administered 2017-12-23 – 2017-12-24 (×3): 100 mg via ORAL
  Filled 2017-12-22 (×3): qty 15

## 2017-12-22 MED ORDER — MENTHOL 3 MG MT LOZG
1.0000 | LOZENGE | OROMUCOSAL | Status: DC | PRN
  Administered 2017-12-23 (×2): 3 mg via ORAL
  Filled 2017-12-22 (×3): qty 9

## 2017-12-22 MED ORDER — SOD CITRATE-CITRIC ACID 500-334 MG/5ML PO SOLN: 30.0000 mL | Freq: Once | ORAL | Status: DC

## 2017-12-22 MED ORDER — FERROUS SULFATE 325 (65 FE) MG PO TABS
325.0000 mg | ORAL_TABLET | Freq: Two times a day (BID) | ORAL | Status: DC
  Administered 2017-12-23 – 2017-12-25 (×5): 325 mg via ORAL
  Filled 2017-12-22 (×5): qty 1

## 2017-12-22 MED ORDER — SENNOSIDES-DOCUSATE SODIUM 8.6-50 MG PO TABS
2.0000 | ORAL_TABLET | ORAL | Status: DC
  Administered 2017-12-22 – 2017-12-25 (×3): 2 via ORAL
  Filled 2017-12-22 (×3): qty 2

## 2017-12-22 MED ORDER — DIPHENHYDRAMINE HCL 25 MG PO CAPS
25.0000 mg | ORAL_CAPSULE | ORAL | Status: DC | PRN
  Filled 2017-12-22: qty 1

## 2017-12-22 MED ORDER — SODIUM CHLORIDE 0.9% FLUSH: 3.0000 mL | INTRAVENOUS | Status: DC | PRN

## 2017-12-22 MED ORDER — MEASLES, MUMPS & RUBELLA VAC ~~LOC~~ INJ: 0.5000 mL | INJECTION | Freq: Once | SUBCUTANEOUS | Status: DC

## 2017-12-22 MED ORDER — PHENYLEPHRINE 8 MG IN D5W 100 ML (0.08MG/ML) PREMIX OPTIME
INJECTION | INTRAVENOUS | Status: AC
  Filled 2017-12-22: qty 100

## 2017-12-22 MED ORDER — NALBUPHINE HCL 10 MG/ML IJ SOLN: 5.0000 mg | INTRAMUSCULAR | Status: DC | PRN

## 2017-12-22 MED ORDER — ZOLPIDEM TARTRATE 5 MG PO TABS: 5.0000 mg | ORAL_TABLET | Freq: Every evening | ORAL | Status: DC | PRN

## 2017-12-22 MED ORDER — FENTANYL CITRATE (PF) 100 MCG/2ML IJ SOLN: 25.0000 ug | INTRAMUSCULAR | Status: DC | PRN

## 2017-12-22 MED ORDER — NALBUPHINE HCL 10 MG/ML IJ SOLN: 5.0000 mg | Freq: Once | INTRAMUSCULAR | Status: AC | PRN

## 2017-12-22 MED ORDER — OXYCODONE-ACETAMINOPHEN 5-325 MG PO TABS
1.0000 | ORAL_TABLET | ORAL | Status: DC | PRN
  Administered 2017-12-23 – 2017-12-25 (×5): 1 via ORAL
  Filled 2017-12-22 (×6): qty 1

## 2017-12-22 MED ORDER — ACETAMINOPHEN 500 MG PO TABS
1000.0000 mg | ORAL_TABLET | Freq: Four times a day (QID) | ORAL | Status: AC
  Administered 2017-12-22 – 2017-12-23 (×3): 1000 mg via ORAL
  Filled 2017-12-22 (×2): qty 2

## 2017-12-22 MED ORDER — METHYLERGONOVINE MALEATE 0.2 MG PO TABS: 0.2000 mg | ORAL_TABLET | ORAL | Status: DC | PRN

## 2017-12-22 MED ORDER — SCOPOLAMINE 1 MG/3DAYS TD PT72: 1.0000 | MEDICATED_PATCH | Freq: Once | TRANSDERMAL | Status: DC

## 2017-12-22 MED ORDER — BUPIVACAINE HCL (PF) 0.25 % IJ SOLN
INTRAMUSCULAR | Status: DC | PRN
  Administered 2017-12-22: 20 mL

## 2017-12-22 MED ORDER — DIPHENHYDRAMINE HCL 25 MG PO CAPS: 25.0000 mg | ORAL_CAPSULE | Freq: Four times a day (QID) | ORAL | Status: DC | PRN

## 2017-12-22 MED ORDER — MEPERIDINE HCL 25 MG/ML IJ SOLN: 6.2500 mg | INTRAMUSCULAR | Status: DC | PRN

## 2017-12-22 MED ORDER — FENTANYL CITRATE (PF) 100 MCG/2ML IJ SOLN
INTRAMUSCULAR | Status: DC | PRN
  Administered 2017-12-22: 10 ug via INTRAVENOUS

## 2017-12-22 MED ORDER — ONDANSETRON HCL 4 MG/2ML IJ SOLN
INTRAMUSCULAR | Status: DC | PRN
  Administered 2017-12-22: 4 mg via INTRAVENOUS

## 2017-12-22 MED ORDER — FENTANYL CITRATE (PF) 100 MCG/2ML IJ SOLN
INTRAMUSCULAR | Status: AC
  Filled 2017-12-22: qty 2

## 2017-12-22 MED ORDER — TETANUS-DIPHTH-ACELL PERTUSSIS 5-2.5-18.5 LF-MCG/0.5 IM SUSP: 0.5000 mL | Freq: Once | INTRAMUSCULAR | Status: DC

## 2017-12-22 MED ORDER — NALBUPHINE HCL 10 MG/ML IJ SOLN
INTRAMUSCULAR | Status: AC
  Filled 2017-12-22: qty 1

## 2017-12-22 MED ORDER — BUPIVACAINE IN DEXTROSE 0.75-8.25 % IT SOLN
INTRATHECAL | Status: DC | PRN
  Administered 2017-12-22: 1.6 mL via INTRATHECAL

## 2017-12-22 MED ORDER — IBUPROFEN 600 MG PO TABS: 600.0000 mg | ORAL_TABLET | Freq: Four times a day (QID) | ORAL | Status: DC

## 2017-12-22 MED ORDER — DIPHENHYDRAMINE HCL 50 MG/ML IJ SOLN: 12.5000 mg | INTRAMUSCULAR | Status: DC | PRN

## 2017-12-22 MED ORDER — COCONUT OIL OIL
1.0000 "application " | TOPICAL_OIL | Status: DC | PRN
  Administered 2017-12-24: 1 via TOPICAL
  Filled 2017-12-22: qty 120

## 2017-12-22 MED ORDER — PHENYLEPHRINE 8 MG IN D5W 100 ML (0.08MG/ML) PREMIX OPTIME
INJECTION | INTRAVENOUS | Status: DC | PRN
  Administered 2017-12-22: 60 ug/min via INTRAVENOUS

## 2017-12-22 MED ORDER — PHENYLEPHRINE HCL 10 MG/ML IJ SOLN
INTRAMUSCULAR | Status: DC | PRN
  Administered 2017-12-22 (×5): 80 ug via INTRAVENOUS

## 2017-12-22 MED ORDER — PRENATAL MULTIVITAMIN CH
1.0000 | ORAL_TABLET | Freq: Every day | ORAL | Status: DC
  Administered 2017-12-23 – 2017-12-25 (×3): 1 via ORAL
  Filled 2017-12-22 (×3): qty 1

## 2017-12-22 MED ORDER — SCOPOLAMINE 1 MG/3DAYS TD PT72
MEDICATED_PATCH | TRANSDERMAL | Status: DC | PRN
  Administered 2017-12-22: 1 via TRANSDERMAL

## 2017-12-22 MED ORDER — MORPHINE SULFATE (PF) 0.5 MG/ML IJ SOLN
INTRAMUSCULAR | Status: DC | PRN
  Administered 2017-12-22: .2 mg via EPIDURAL

## 2017-12-22 MED ORDER — OXYTOCIN 10 UNIT/ML IJ SOLN
INTRAMUSCULAR | Status: AC
  Filled 2017-12-22: qty 4

## 2017-12-22 MED ORDER — FAMOTIDINE 20 MG PO TABS: 20.0000 mg | ORAL_TABLET | Freq: Once | ORAL | Status: DC

## 2017-12-22 MED ORDER — ACETAMINOPHEN 500 MG PO TABS
ORAL_TABLET | ORAL | Status: AC
  Filled 2017-12-22: qty 2

## 2017-12-22 MED ORDER — OXYTOCIN 40 UNITS IN LACTATED RINGERS INFUSION - SIMPLE MED: 2.5000 [IU]/h | INTRAVENOUS | Status: AC

## 2017-12-22 MED ORDER — PROMETHAZINE HCL 25 MG/ML IJ SOLN: 6.2500 mg | INTRAMUSCULAR | Status: DC | PRN

## 2017-12-22 MED ORDER — OXYTOCIN 10 UNIT/ML IJ SOLN
INTRAVENOUS | Status: DC | PRN
  Administered 2017-12-22: 40 [IU] via INTRAVENOUS

## 2017-12-22 MED ORDER — NALBUPHINE HCL 10 MG/ML IJ SOLN
5.0000 mg | Freq: Once | INTRAMUSCULAR | Status: AC | PRN
  Administered 2017-12-22: 5 mg via INTRAVENOUS

## 2017-12-22 MED ORDER — LACTATED RINGERS IV SOLN
INTRAVENOUS | Status: DC
  Administered 2017-12-22 (×3): via INTRAVENOUS

## 2017-12-22 MED ORDER — SODIUM CHLORIDE 0.9 % IR SOLN
Status: DC | PRN
  Administered 2017-12-22: 1000 mL

## 2017-12-22 MED ORDER — SCOPOLAMINE 1 MG/3DAYS TD PT72
MEDICATED_PATCH | TRANSDERMAL | Status: AC
  Filled 2017-12-22: qty 1

## 2017-12-22 MED ORDER — ACETAMINOPHEN 325 MG PO TABS: 650.0000 mg | ORAL_TABLET | ORAL | Status: DC | PRN

## 2017-12-22 MED ORDER — LACTATED RINGERS IV SOLN: INTRAVENOUS | Status: DC

## 2017-12-22 MED ORDER — KETOROLAC TROMETHAMINE 30 MG/ML IJ SOLN: 30.0000 mg | Freq: Four times a day (QID) | INTRAMUSCULAR | Status: AC | PRN

## 2017-12-22 MED ORDER — IBUPROFEN 600 MG PO TABS
600.0000 mg | ORAL_TABLET | Freq: Four times a day (QID) | ORAL | Status: DC
  Administered 2017-12-22 – 2017-12-25 (×11): 600 mg via ORAL
  Filled 2017-12-22 (×11): qty 1

## 2017-12-22 MED ORDER — SIMETHICONE 80 MG PO CHEW
80.0000 mg | CHEWABLE_TABLET | ORAL | Status: DC
  Administered 2017-12-22 – 2017-12-25 (×3): 80 mg via ORAL
  Filled 2017-12-22 (×3): qty 1

## 2017-12-22 SURGICAL SUPPLY — 36 items
BENZOIN TINCTURE PRP APPL 2/3 (GAUZE/BANDAGES/DRESSINGS) ×3 IMPLANT
BOOTIES KNEE HIGH SLOAN (MISCELLANEOUS) ×6 IMPLANT
CHLORAPREP W/TINT 26ML (MISCELLANEOUS) ×3 IMPLANT
CLAMP CORD UMBIL (MISCELLANEOUS) IMPLANT
CLOSURE WOUND 1/2 X4 (GAUZE/BANDAGES/DRESSINGS) ×1
CLOTH BEACON ORANGE TIMEOUT ST (SAFETY) ×3 IMPLANT
DRAIN JACKSON PRT FLT 10 (DRAIN) IMPLANT
DRSG OPSITE POSTOP 4X10 (GAUZE/BANDAGES/DRESSINGS) ×3 IMPLANT
ELECT REM PT RETURN 9FT ADLT (ELECTROSURGICAL) ×3
ELECTRODE REM PT RTRN 9FT ADLT (ELECTROSURGICAL) ×1 IMPLANT
EVACUATOR SILICONE 100CC (DRAIN) IMPLANT
EXTRACTOR VACUUM M CUP 4 TUBE (SUCTIONS) IMPLANT
EXTRACTOR VACUUM M CUP 4' TUBE (SUCTIONS)
GLOVE BIOGEL PI IND STRL 7.0 (GLOVE) ×2 IMPLANT
GLOVE BIOGEL PI INDICATOR 7.0 (GLOVE) ×4
GLOVE ECLIPSE 6.5 STRL STRAW (GLOVE) ×3 IMPLANT
GOWN STRL REUS W/TWL LRG LVL3 (GOWN DISPOSABLE) ×6 IMPLANT
KIT ABG SYR 3ML LUER SLIP (SYRINGE) IMPLANT
NEEDLE HYPO 22GX1.5 SAFETY (NEEDLE) ×3 IMPLANT
NEEDLE HYPO 25X5/8 SAFETYGLIDE (NEEDLE) IMPLANT
NS IRRIG 1000ML POUR BTL (IV SOLUTION) ×6 IMPLANT
PACK C SECTION WH (CUSTOM PROCEDURE TRAY) ×3 IMPLANT
PAD OB MATERNITY 4.3X12.25 (PERSONAL CARE ITEMS) ×3 IMPLANT
PENCIL SMOKE EVAC W/HOLSTER (ELECTROSURGICAL) ×3 IMPLANT
RTRCTR C-SECT PINK 25CM LRG (MISCELLANEOUS) ×3 IMPLANT
STRIP CLOSURE SKIN 1/2X4 (GAUZE/BANDAGES/DRESSINGS) ×2 IMPLANT
SUT MNCRL AB 3-0 PS2 27 (SUTURE) ×3 IMPLANT
SUT SILK 2 0 FSL 18 (SUTURE) IMPLANT
SUT VIC AB 0 CTX 36 (SUTURE) ×4
SUT VIC AB 0 CTX36XBRD ANBCTRL (SUTURE) ×2 IMPLANT
SUT VIC AB 1 CT1 36 (SUTURE) ×6 IMPLANT
SUT VIC AB 2-0 CT1 27 (SUTURE)
SUT VIC AB 2-0 CT1 TAPERPNT 27 (SUTURE) IMPLANT
SYR 20CC LL (SYRINGE) ×3 IMPLANT
TOWEL OR 17X24 6PK STRL BLUE (TOWEL DISPOSABLE) ×3 IMPLANT
TRAY FOLEY BAG SILVER LF 14FR (SET/KITS/TRAYS/PACK) ×3 IMPLANT

## 2017-12-22 NOTE — Transfer of Care (Signed)
Immediate Anesthesia Transfer of Care Note  Patient: Brenda Lozano  Procedure(s) Performed: CESAREAN SECTION (N/A Abdomen)  Patient Location: PACU  Anesthesia Type:Spinal  Level of Consciousness: awake  Airway & Oxygen Therapy: Patient Spontanous Breathing  Post-op Assessment: Report given to RN  Post vital signs: Reviewed  Last Vitals:  Vitals:   12/22/17 1158  BP: 132/85  Pulse: 96  Resp: 18  Temp: 37.3 C    Last Pain:  Vitals:   12/22/17 1158  TempSrc: Oral      Patients Stated Pain Goal: 4 (12/22/17 1158)  Complications: No apparent anesthesia complications

## 2017-12-22 NOTE — Op Note (Signed)
Preoperative diagnosis: Intrauterine pregnancy at 39 weeks and 4 days   Post operative diagnosis: Same  Anesthesia: Spinal  Anesthesiologist: Dr. Desmond Lopeurk  Procedure: repeat  low transverse cesarean section  Surgeon: Dr. Dois DavenportSandra Maclane Holloran  Assistant: Illene BolusLori Clemmons CNM  Estimated blood loss: 600 cc  Procedure:  After being informed of the planned procedure and possible complications including bleeding, infection, injury to other organs, informed consent is obtained. The patient is taken to OR #9 and given spinal anesthesia without complication. She is placed in the dorsal decubitus position with the pelvis tilted to the left. She is then prepped and draped in a sterile fashion. A Foley catheter is inserted in her bladder.  After assessing adequate level of anesthesia, we infiltrate the suprapubic area with 20 cc of Marcaine 0.25 and perform a Pfannenstiel incision which is brought down sharply to the fascia. The fascia is entered in a low transverse fashion. Linea alba is dissected. Peritoneum is entered in a midline fashion. An Alexis retractor is easily positioned.   The myometrium is then entered in a low transverse fashion, 2 cm above the vesico-uterine junction ; first with knife and then extended bluntly. Amniotic fluid is clear. We assist the birth of a female  infant in vertex presentation. Mouth and nose are suctioned. The baby is delivered. The cord is clamped and sectioned. The baby is given to the neonatologist present in the room.  10 cc of blood is drawn from the umbilical vein.The placenta is allowed to deliver spontaneously. It is complete and the cord has 3 vessels. Uterine revision is negative.  We proceed with closure of the myometrium in 2 layers: First with a running locked suture of 0 Vicryl, then with a Lembert suture of 0 Vicryl imbricating the first one. Hemostasis is completed with cauterization on peritoneal edges.  Both paracolic gutters are cleaned. Both tubes and  ovaries are assessed and normal. The pelvis is profusely irrigated with warm saline to confirm a satisfactory hemostasis.  Retractors and sponges are removed. Under fascia hemostasis is completed with cauterization. The fascia is then closed with 2 running sutures of 0 Vicryl meeting midline. The wound is irrigated with warm saline and hemostasis is completed with cauterization. The skin is closed with a subcuticular suture of 3-0 Monocryl and Steri-Strips.  Instrument and sponge count is complete x2. Estimated blood loss is 600 cc.  The procedure is well tolerated by the patient who is taken to recovery room in a well and stable condition.  female baby named Laney PotashKaris was born at 14:18 and received an Apgar of 8  at 1 minute and 8 at 5 minutes.    Specimen: Placenta sent to L & D   Crist FatSandra A Ahron Hulbert MD 2/1/20191:38 PM

## 2017-12-22 NOTE — Anesthesia Procedure Notes (Signed)
Spinal  Patient location during procedure: OR Start time: 12/22/2017 1:35 PM End time: 12/22/2017 1:38 PM Staffing Anesthesiologist: Cecile Hearingurk, Stephen Edward, MD Performed: anesthesiologist  Preanesthetic Checklist Completed: patient identified, surgical consent, pre-op evaluation, timeout performed, IV checked, risks and benefits discussed and monitors and equipment checked Spinal Block Patient position: sitting Prep: site prepped and draped and DuraPrep Patient monitoring: continuous pulse ox and blood pressure Approach: midline Location: L3-4 Injection technique: single-shot Needle Needle type: Pencan  Needle gauge: 24 G Assessment Sensory level: T4 Additional Notes Functioning IV was confirmed and monitors were applied. Sterile prep and drape, including hand hygiene, mask and sterile gloves were used. The patient was positioned and the spine was prepped. The skin was anesthetized with lidocaine.  Free flow of clear CSF was obtained prior to injecting local anesthetic into the CSF.  The spinal needle aspirated freely following injection.  The needle was carefully withdrawn.  The patient tolerated the procedure well. Consent was obtained prior to procedure with all questions answered and concerns addressed. Risks including but not limited to bleeding, infection, nerve damage, paralysis, failed block, inadequate analgesia, allergic reaction, high spinal, itching and headache were discussed and the patient wished to proceed.   Arrie AranStephen Turk, MD

## 2017-12-22 NOTE — H&P (Signed)
Brenda Lozano is a 32 y.o.G3P1011 @ 6019w1d  female presenting for Repeat C/S . Pregnancy course complicated by BMI 37 @ first prenatal visit; hx of asthma-no meds, hx of anxiety-no meds varicella non immune,  Right arm mid axilla enlarged node that will need to be followed postpartum OB History    Gravida Para Term Preterm AB Living   3 1 1  0 1 1   SAB TAB Ectopic Multiple Live Births   0 0 0 0 1     Past Medical History:  Diagnosis Date  . Anxiety    zoloft  . Hyperchloremia    H/C  . No pertinent past medical history    Past Surgical History:  Procedure Laterality Date  . CESAREAN SECTION  12/26/2011   Procedure: CESAREAN SECTION;  Surgeon: Esmeralda ArthurSandra A Soliyana Mcchristian, MD;  Location: [facility];  Service: Gynecology;  Laterality: N/A;  . WISDOM TOOTH EXTRACTION     Family History: family history includes Anxiety disorder in her mother; Asthma in her mother; Cancer in her maternal grandmother; Diabetes in her maternal uncle; Hypertension in her maternal grandmother and mother; Mental illness in her mother; Thyroid disease in her mother. Social History:  reports that  has never smoked. she has never used smokeless tobacco. She reports that she drinks about 1.0 oz of alcohol per week. She reports that she does not use drugs.     Maternal Diabetes: No Genetic Screening: Normal Maternal Ultrasounds/Referrals: Normal Fetal Ultrasounds or other Referrals:  None Maternal Substance Abuse:  No Significant Maternal Medications:  None Significant Maternal Lab Results:  Lab values include: Group B Strep negative Other Comments:  None  Review of Systems  All other systems reviewed and are negative.  Maternal Medical History:  Reason for admission: Scheduled repeat C/S  Contractions: Frequency: rare.    Fetal activity: Perceived fetal activity is normal.    Prenatal Complications - Diabetes: none.      Blood pressure 132/85, pulse 96, temperature 99.1 F (37.3 C), temperature source Oral,  resp. rate 18, height 5\' 5"  (1.651 m), weight 246 lb (111.6 kg). Maternal Exam:  Uterine Assessment: Contraction frequency is rare.   Abdomen: Patient reports no abdominal tenderness. Surgical scars: low transverse.   Estimated fetal weight is 8.5 pounds.    Introitus: not evaluated.   Cervix: not evaluated.   Physical Exam  Nursing note and vitals reviewed. Constitutional: She is oriented to person, place, and time. She appears well-developed and well-nourished.  HENT:  Head: Normocephalic.  Neck: Normal range of motion.  Cardiovascular: Normal rate.  Respiratory: Effort normal. No respiratory distress.  GI: Soft. There is no tenderness.  Musculoskeletal: Normal range of motion.  Neurological: She is alert and oriented to person, place, and time.  Skin: Skin is warm and dry.  Psychiatric: She has a normal mood and affect. Her behavior is normal. Thought content normal.    Prenatal labs: ABO, Rh: --/--/B POS (01/31 0950) Antibody: NEG (01/31 0950) Rubella: Immune (06/06 0000) RPR: Non Reactive (01/31 0950)  HBsAg: Negative (06/06 0000)  HIV: Non-reactive (06/06 0000)  GBS:   Negative Varicella non immune Assessment/Plan: IUP @ 39+1 Scheduled Repeat C/S with Dr Estanislado Pandyivard Routine C/S orders   Brenda Lozano CNM 12/22/2017, 12:13 PM

## 2017-12-22 NOTE — Interval H&P Note (Signed)
History and Physical Interval Note:  12/22/2017 1:13 PM  Brenda Lozano  has presented today for surgery, with the diagnosis of * No pre-op diagnosis entered *  The various methods of treatment have been discussed with the patient and family. After consideration of risks, benefits and other options for treatment, the patient has consented to  Procedure(s): CESAREAN SECTION (N/A) as a surgical intervention .  The patient's history has been reviewed, patient examined, no change in status, stable for surgery.  I have reviewed the patient's chart and labs.  Questions were answered to the patient's satisfaction.     Dois DavenportSandra A Silverio Hagan

## 2017-12-22 NOTE — Anesthesia Preprocedure Evaluation (Signed)
Anesthesia Evaluation  Patient identified by MRN, date of birth, ID band Patient awake    Reviewed: Allergy & Precautions, NPO status , Patient's Chart, lab work & pertinent test results  Airway Mallampati: III  TM Distance: >3 FB Neck ROM: Full    Dental  (+) Teeth Intact, Dental Advisory Given   Pulmonary neg pulmonary ROS,    Pulmonary exam normal breath sounds clear to auscultation       Cardiovascular negative cardio ROS Normal cardiovascular exam Rhythm:Regular Rate:Normal     Neuro/Psych negative neurological ROS     GI/Hepatic negative GI ROS, Neg liver ROS,   Endo/Other  Morbid obesity  Renal/GU negative Renal ROS     Musculoskeletal negative musculoskeletal ROS (+)   Abdominal   Peds  Hematology negative hematology ROS (+) Plt 211k   Anesthesia Other Findings Day of surgery medications reviewed with the patient.  Reproductive/Obstetrics (+) Pregnancy H/o C-section x1                             Anesthesia Physical Anesthesia Plan  ASA: III  Anesthesia Plan: Spinal   Post-op Pain Management:    Induction:   PONV Risk Score and Plan: 2 and Treatment may vary due to age or medical condition, Dexamethasone, Ondansetron and Scopolamine patch - Pre-op  Airway Management Planned:   Additional Equipment:   Intra-op Plan:   Post-operative Plan:   Informed Consent: I have reviewed the patients History and Physical, chart, labs and discussed the procedure including the risks, benefits and alternatives for the proposed anesthesia with the patient or authorized representative who has indicated his/her understanding and acceptance.   Dental advisory given  Plan Discussed with: CRNA, Anesthesiologist and Surgeon  Anesthesia Plan Comments: (Discussed risks and benefits of and differences between spinal and general. Discussed risks of spinal including headache, backache,  failure, bleeding, infection, and nerve damage. Patient consents to spinal. Questions answered. Coagulation studies and platelet count acceptable.)        Anesthesia Quick Evaluation

## 2017-12-22 NOTE — Anesthesia Postprocedure Evaluation (Signed)
Anesthesia Post Note  Patient: Brenda Lozano  Procedure(s) Performed: CESAREAN SECTION (N/A Abdomen)     Patient location during evaluation: PACU Anesthesia Type: Spinal Level of consciousness: oriented and awake and alert Pain management: pain level controlled Vital Signs Assessment: post-procedure vital signs reviewed and stable Respiratory status: spontaneous breathing, respiratory function stable and patient connected to nasal cannula oxygen Cardiovascular status: blood pressure returned to baseline and stable Postop Assessment: no headache, no backache, no apparent nausea or vomiting, spinal receding and patient able to bend at knees Anesthetic complications: no    Last Vitals:  Vitals:   12/22/17 1600 12/22/17 1615  BP: (!) 123/59 123/72  Pulse: 89 79  Resp: 15 19  Temp: 36.4 C   SpO2: 99% 99%    Last Pain:  Vitals:   12/22/17 1600  TempSrc:   PainSc: 4    Pain Goal: Patients Stated Pain Goal: 3 (12/22/17 1600)               Cecile HearingStephen Edward Turk

## 2017-12-23 LAB — CBC
HEMATOCRIT: 35.8 % — AB (ref 36.0–46.0)
Hemoglobin: 11.8 g/dL — ABNORMAL LOW (ref 12.0–15.0)
MCH: 27.1 pg (ref 26.0–34.0)
MCHC: 33 g/dL (ref 30.0–36.0)
MCV: 82.3 fL (ref 78.0–100.0)
Platelets: 217 10*3/uL (ref 150–400)
RBC: 4.35 MIL/uL (ref 3.87–5.11)
RDW: 15.6 % — AB (ref 11.5–15.5)
WBC: 19.2 10*3/uL — ABNORMAL HIGH (ref 4.0–10.5)

## 2017-12-23 NOTE — Lactation Note (Signed)
This note was copied from a baby's chart. Lactation Consultation Note  Patient Name: Brenda Lozano ZOXWR'UToday's Date: 12/23/2017 Reason for consult: Initial assessment;Term   Initial consult with Exp BF mom of 31 hour old infant. Infant with 7 BF for 15-30 minutes, 2 BF attempts, 2 voids and 3 stools since birth. LATCH scores 7. Infant weight 7 lb 1.4 oz with weight loss of 2%. Infant asleep in mom's arms.   Mom reports infant is feeding well. She has cluster fed at times. Mom is having trouble getting her mouth open wide enough at times. Discussed stroking from nose to chin, chin tug once latched and expression of colostrum prior to latch. Mom reports she is able to hand express colostrum.   Bf basics, colostrum, hand expression, milk coming to volume, nipple care, and cluster feeding reviewed. Enc mom to feed STS 8-12 x in 24 hours at first feeding cues. Enc mom to hand express before and after feedings. Enc mom to use good head and pillow support with feedings.   BF Resources handout and [facility] broch[facility] given, mom informed of IP/OP Services, BF Support Groups and LC phone #. Enc mom to call out for feeding assistance as needed.   Mom denies questions/concerns at this time. Mom to call out as needed.   Maternal Data Formula Feeding for Exclusion: No Has patient been taught Hand Expression?: Yes Does the patient have breastfeeding experience prior to this delivery?: Yes  Feeding    LATCH Score                   Interventions Interventions: Breast feeding basics reviewed;Skin to skin;Breast massage;Breast compression;Hand express;Support pillows  Lactation Tools Discussed/Used WIC Program: No   Consult Status Consult Status: Follow-up Date: 12/24/17 Follow-up type: In-patient    Silas FloodSharon S Kellyjo Edgren 12/23/2017, 9:25 PM

## 2017-12-23 NOTE — Progress Notes (Signed)
Subjective: Postpartum Day 1: Cesarean Delivery Repeat Patient reports tolerating PO, + flatus and no problems voiding.    Objective: Vital signs in last 24 hours: Temp:  [97.9 F (36.6 C)-99 F (37.2 C)] 98.5 F (36.9 C) (02/02 1347) Pulse Rate:  [69-107] 80 (02/02 1347) Resp:  [16-20] 20 (02/02 1347) BP: (116-130)/(68-78) 122/78 (02/02 1347) SpO2:  [98 %-100 %] 99 % (02/02 40980633)  Physical Exam:  General: alert, cooperative and appears stated age Lochia: appropriate Uterine Fundus: firm Incision: CDI DVT Evaluation: No evidence of DVT seen on physical exam.  Recent Labs    12/22/17 1730 12/23/17 0519  HGB 13.6 11.8*  HCT 40.5 35.8*    Assessment/Plan: Status post Cesarean section. Doing well postoperatively.  Continue current care.  Ericha Whittingham A Taeja Debellis CNM 12/23/2017, 5:25 PM

## 2017-12-23 NOTE — Anesthesia Postprocedure Evaluation (Signed)
Anesthesia Post Note  Patient: Brenda Lozano  Procedure(s) Performed: CESAREAN SECTION (N/A Abdomen)     Patient location during evaluation: Mother Baby Anesthesia Type: Spinal Level of consciousness: awake Pain management: satisfactory to patient Vital Signs Assessment: post-procedure vital signs reviewed and stable Respiratory status: spontaneous breathing Cardiovascular status: stable Anesthetic complications: no    Last Vitals:  Vitals:   12/23/17 0420 12/23/17 0633  BP: 126/70   Pulse: 70   Resp: 18   Temp: 36.7 C   SpO2: 99% 99%    Last Pain:  Vitals:   12/23/17 0633  TempSrc:   PainSc: 0-No pain   Pain Goal: Patients Stated Pain Goal: 3 (12/22/17 1615)               Cephus ShellingBURGER,Valgene Deloatch

## 2017-12-23 NOTE — Addendum Note (Signed)
Addendum  created 12/23/17 0815 by Algis GreenhouseBurger, Rochella Benner A, CRNA   Charge Capture section accepted, Sign clinical note

## 2017-12-23 NOTE — Clinical Social Work Maternal (Signed)
  CLINICAL SOCIAL WORK MATERNAL/CHILD NOTE  Patient Details  Name: Brenda Lozano MRN: [id number] Date of Birth: September 19, 1986  Date:  12/23/2017  Clinical Social Worker Initiating Note:  Charlott Calvario Lavonna Monarch lcsw Date/Time: Initiated:  12/23/17/      Child's Name:  Brenda Lozano   Biological Parents:  Mother, Father   Need for Interpreter:  None   Reason for Referral:  Other (Comment)(history of anxiety)   Address:  [place] [address]    Phone number:  [phone] (home)     Additional phone number:   Household Members/Support Persons (HM/SP):   Household Member/Support Person 1   HM/SP Name Relationship DOB or Age  HM/SP -[place] spouse    HM/SP -2        HM/SP -3        HM/SP -4        HM/SP -5        HM/SP -6        HM/SP -7        HM/SP -8          Natural Supports (not living in the home):  Immediate Family, Extended Family   Professional Supports: None   Employment: Unemployed   Type of Work:     Education:      Homebound arranged:    Pensions consultant:  Multimedia programmer   Other Resources:      Cultural/Religious Considerations Which May Impact Care:    Strengths:  Ability to meet basic needs , Home prepared for child    Psychotropic Medications:         Pediatrician:       Pediatrician List:   [place]      Pediatrician Fax Number:    Risk Factors/Current Problems:  None   Cognitive State:  Able to Concentrate , Alert    Mood/Affect:  Calm , Happy    CSW Assessment: LCSW consulted for MOB history of anxiety.  LCSW met with MOB, FOB and maternal grandmother at bedside to assess for services.  MOB reported that this was her 2nd baby and introduced LCSW to her  30 year old son who was in the room.  MOB reported that she had been given Zoloft in June stating she was not taking it right and only taking it when she felt  stress.  MOB reported that she stopped taking it stating "I feel OK and do not need anything".  MOB reported that her mother was staying with her for two weeks to help with newborn.  MOB reported that they had all equipment needed to take newborn home and declined any need for services. RN reported no concerns.  No referrals at this time.   CSW Plan/Description:  No Further Intervention Required/No Barriers to Discharge    Carlean Jews, LCSW 12/23/2017, 4:24 PM

## 2017-12-24 DIAGNOSIS — Z98891 History of uterine scar from previous surgery: Secondary | ICD-10-CM

## 2017-12-24 MED ORDER — SALINE SPRAY 0.65 % NA SOLN
1.0000 | NASAL | Status: DC | PRN
  Filled 2017-12-24: qty 44

## 2017-12-24 NOTE — Discharge Instructions (Signed)

## 2017-12-24 NOTE — Progress Notes (Signed)
Brenda Lozano [id number] Postpartum Day 2 S/P Repeat Cesarean Section due to Patient Desire  Subjective: Patient up ad lib, denies syncope or dizziness. Reports consuming regular diet without issues and denies N/V. Patient reports no bowel movement , but is passing flatus.  Reports pain with urination, but contributes this to overall pain of body.  Patient is breastfeeding and reports going well.  Desires IUD for postpartum contraception.  Pain is being appropriately managed with use of percocet and ibuprofen.   Objective: Temp:  [98.5 F (36.9 C)-99.1 F (37.3 C)] 98.7 F (37.1 C) (02/03 0535) Pulse Rate:  [73-82] 79 (02/03 0535) Resp:  [18-20] 18 (02/03 0535) BP: (117-124)/(72-78) 124/72 (02/03 0535)  Recent Labs    12/21/17 0950 12/22/17 1730 12/23/17 0519  HGB 13.9 13.6 11.8*  HCT 40.9 40.5 35.8*  WBC 12.4* 14.0* 19.2*    Physical Exam:  General: alert, cooperative and no distress Mood/Affect: Appropriate/Appropriate Lungs: clear to auscultation, no wheezes, rales or rhonchi, symmetric air entry.  Heart: normal rate and regular rhythm. Breast: breasts appear normal, no suspicious masses, no skin or nipple changes or axillary nodes. Abdomen:  + bowel sounds, Soft, Appropriately Tender, Mild Distention  Incision: healing well, Honeycomb dressing  Uterine Fundus: firm Lochia: appropriate Skin: Warm, Dry. DVT Evaluation: No evidence of DVT seen on physical exam. No significant calf/ankle edema. JP drain:   None  Assessment Post Operative Day 1 S/P Repeat C/S Normal Involution BreastFeeding Hemodynamically Stable  Plan: -Encouraged rest -Discussed pain management and to verbalize increases in pain level -Plan for discharge tomorrow -Continue other mgmt as ordered -Dr. Katharine LookJ.Ozan to be updated on patient status  Brenda RobinsJessica L Carla Rashad MSN, CNM 12/24/2017, 7:00 AM

## 2017-12-24 NOTE — Discharge Summary (Signed)
OB Discharge Summary     Patient Name: Brenda Lozano DOB: July 01, 1986 MRN: [id number]  Date of admission: 12/22/2017 Delivering MD: Silverio LayIVARD, SANDRA   Date of discharge: 12/25/2017  Admitting diagnosis: c-section Intrauterine pregnancy: 6186w3d     Secondary diagnosis:  Principal Problem:   Status post repeat low transverse cesarean section  Additional problems: NA     Discharge diagnosis: Term Pregnancy Delivered, repeat cesarean                                                                                                Post partum procedures:NA  Augmentation: NA  Complications: None  Hospital course:  Sceduled C/S   32 y.o. yo G3P1011 at [redacted]w[redacted]d was admitted to the hospital 12/22/2017 for scheduled cesarean section with the following indication:Elective Repeat.  Membrane Rupture Time/Date: 2:18 PM ,12/22/2017   Patient delivered a Viable infant.12/22/2017  Details of operation can be found in separate operative note.  Pateint had an uncomplicated postpartum course.  She is ambulating, tolerating a regular diet, passing flatus, and urinating well. Patient is discharged home in stable condition on  12/25/17   She is sent home with Rx for Ibuprophen and limited Rx for Percocet.         Physical exam  Vitals:   12/23/17 1741 12/24/17 0535 12/24/17 1750 12/25/17 0529  BP: 120/77 124/72 130/86 124/72  Pulse: 82 79 77 72  Resp: 18 18 18 18   Temp: 99.1 F (37.3 C) 98.7 F (37.1 C) 98.8 F (37.1 C) 98.4 F (36.9 C)  TempSrc:  Oral Oral Oral  SpO2:   100%   Weight:      Height:       General: alert and cooperative Lochia: appropriate Uterine Fundus: firm Incision: Healing well with no significant drainage, Dressing is clean, dry, and intact DVT Evaluation: No evidence of DVT seen on physical exam. Negative Homan's sign. Labs: CBC Latest Ref Rng & Units 12/23/2017 12/22/2017 12/21/2017  WBC 4.0 - 10.5 K/uL 19.2(H) 14.0(H) 12.4(H)  Hemoglobin 12.0 - 15.0 g/dL 11.8(L) 13.6  13.9  Hematocrit 36.0 - 46.0 % 35.8(L) 40.5 40.9  Platelets 150 - 400 K/uL 217 206 211   CMP Latest Ref Rng & Units 12/22/2017  Glucose 70 - 99 mg/dL -  BUN 6 - 23 mg/dL -  Creatinine 0.860.44 - 5.781.00 mg/dL 4.690.75  Sodium 629135 - 528145 mEq/L -  Potassium 3.5 - 5.1 mEq/L -  Chloride 96 - 112 mEq/L -  CO2 19 - 32 mEq/L -  Calcium 8.4 - 10.5 mg/dL -  Total Protein 6.0 - 8.3 g/dL -  Total Bilirubin 0.3 - 1.2 mg/dL -  Alkaline Phos 39 - 413117 U/L -  AST 0 - 37 U/L -  ALT 0 - 35 U/L -    Discharge instruction: per After Visit Summary and "Baby and Me Booklet".  After visit meds:  Allergies as of 12/25/2017      Reactions   Fish-derived Products    Patient is allergic to catfish and [place]a [place] mushrooms   Mushroom Extract Complex    Tape Hives,  Itching   PT IS ALLERGIC TO ADHESIVE TAPE      Medication List    TAKE these medications   ibuprofen 600 MG tablet Commonly known as:  ADVIL,MOTRIN Take 1 tablet (600 mg total) by mouth every 6 (six) hours as needed.   loratadine 10 MG tablet Commonly known as:  CLARITIN Take 10 mg by mouth daily.   oxyCODONE-acetaminophen 5-325 MG tablet Commonly known as:  PERCOCET/ROXICET Take 1 tablet by mouth every 6 (six) hours as needed (pain scale 4-7).   prenatal multivitamin Tabs tablet Take 1 tablet by mouth daily.       Diet: routine diet  Activity: Advance as tolerated. Pelvic rest for 6 weeks.   Outpatient follow up:5 weeks Follow up Appt:No future appointments. Follow up Visit:No Follow-up on file.  Postpartum contraception: IUD Mirena  Newborn Data: Live born female  Birth Weight: 7 lb 4.1 oz (3290 g) APGAR: 8, 8  Newborn Delivery   Birth date/time:  12/22/2017 14:18:00 Delivery type:  C-Section, Low Transverse C-section categorization:  Repeat     Baby Feeding: Breast Disposition:home with mother     12/25/2017 Brenda Lozano, CNM

## 2017-12-24 NOTE — Lactation Note (Addendum)
This note was copied from a baby's chart. Lactation Consultation Note  Patient Name: Brenda Lozano [id number]'BToday's Date: 12/24/2017 Reason for consult: Follow-up assessment;Nipple pain/trauma;Other (Comment);Term(per mom noted some bleeding from nipples/ L C did  not note breakdown )  Baby is 3249 hours old  LC reviewed and updated the doc flow sheets Per mom noted small tiny blister like areas on nipples with some bleeding.  LC assessed after baby released and nipple well rounded./ swallows heard at latch.  LC recommended to improve depth at the breast to place her pillow behind her vertically  When she goes to feed the baby so the baby will have room for her feet in the football  Position. It will also help the baby to obtain the depth at the breast.  After breast massage, hand express, pre - pump will prime the milk ducts to enhance the  Baby to stay in a pattern at the breast.  LC instructed mom on the use comfort gels and hand pump.  Will check on mom tomorrow.     Maternal Data Has patient been taught Hand Expression?: Yes(LC reviewed )  Feeding - ( MBURN assisted mom to latch and LC checked latch afterwards  Feeding Type: (LC repositioned baby by shifting hips back to towards the back of bed for better depth, pe rmom more comfortable ) Length of feed: 15 min(few swallows / LC observed the baby already latched )  LATCH Score Latch: (latched with depth )  Audible Swallowing: (few swallows )  Type of Nipple: (nipple well rounded when baby released )  Comfort (Breast/Nipple): Filling, red/small blisters or bruises, mild/mod discomfort  Hold (Positioning): Assistance needed to correctly position infant at breast and maintain latch.  LATCH Score: 7  Interventions Interventions: Breast feeding basics reviewed  Lactation Tools Discussed/Used Tools: Pump Breast pump type: Manual Pump Review: Setup, frequency, and cleaning Initiated by:: MAI  Date initiated::  12/24/17   Consult Status Consult Status: Follow-up Date: 12/25/17 Follow-up type: In-patient    Brenda Lozano 12/24/2017, 3:38 PM

## 2017-12-25 ENCOUNTER — Encounter: Payer: Self-pay | Admitting: Obstetrics and Gynecology

## 2017-12-25 MED ORDER — OXYCODONE-ACETAMINOPHEN 5-325 MG PO TABS: 1.0000 | ORAL_TABLET | Freq: Four times a day (QID) | ORAL | 0 refills | Status: AC | PRN

## 2017-12-25 MED ORDER — IBUPROFEN 600 MG PO TABS: 600.0000 mg | ORAL_TABLET | Freq: Four times a day (QID) | ORAL | 2 refills | Status: AC | PRN

## 2017-12-25 NOTE — Addendum Note (Signed)
Addendum  created 12/25/17 1915 by Cecile Hearingurk, Stephen Edward, MD   Intraprocedure Event deleted, Intraprocedure Event edited

## 2017-12-25 NOTE — Lactation Note (Signed)
This note was copied from a baby's chart. Lactation Consultation Note  Patient Name: Brenda Lozano ZOXWR'UToday's Date: 12/25/2017 Reason for consult: Follow-up assessment;Infant weight loss;Term(10% weight loss, )  Baby is 8469 hours old , awake and hungry LC assisted to latch on the left / football for 15 mins with multiple swallows, increased with breast  Compressions. Nipple well rounded.  Baby calmer after feeding. LC assisted to latch on the right breast / football/ at 1st baby fussy until  Depth achieved. LC recommended due to this fussiness - prior to latch breast massage , hand express,  Pre- pump to prime milk ducts to enhance the flow and volume to baby.  Baby fed 2nd breast fror 10 mins , swallows.  Sore nipple and engorgement prevention and tx reviewed.  Mom has a hand pump and a DEBP for home  Per mom sore ness has improved with comfort gels.  Today LC instructed mom on the use shells to enhance compressibility of the areola for a deeper latch. And to help soreness.  Per mom felt better after baby fed both breast.  LC discussed nutritive vs non-nutritive feeding patterns and to watch for hanging out latched.  Also recommended holding off on pacifier for 3 weeks.  LC recommended adding post pumping both breast for 10 -15 mins , and supplement after breast feeding to increase weight.  Mother informed of post-discharge support and given phone number to the lactation department, including services for phone call assistance; out-patient appointments; and breastfeeding support group. List of other breastfeeding resources in the community given in the handout. Encouraged mother to call for problems or concerns related to breastfeeding.     Maternal Data Formula Feeding for Exclusion: No Has patient been taught Hand Expression?: Yes Does the patient have breastfeeding experience prior to this delivery?: Yes  Feeding Feeding Type: Breast Fed Length of feed: (swallows noted,  increaased with breast compressions )  LATCH Score Latch: Grasps breast easily, tongue down, lips flanged, rhythmical sucking.  Audible Swallowing: Spontaneous and intermittent  Type of Nipple: Everted at rest and after stimulation  Comfort (Breast/Nipple): Filling, red/small blisters or bruises, mild/mod discomfort  Hold (Positioning): Assistance needed to correctly position infant at breast and maintain latch.  LATCH Score: 8  Interventions Interventions: Breast feeding basics reviewed;Assisted with latch;Skin to skin;Breast massage;Hand express;Breast compression;Adjust position;Support pillows;Position options;Expressed milk;Shells;Comfort gels;Hand pump  Lactation Tools Discussed/Used Tools: Shells;Comfort gels Shell Type: Inverted Breast pump type: Manual   Consult Status Consult Status: Complete Date: 12/25/17 Follow-up type: In-patient    Matilde SprangMargaret Ann Jahmez Bily 12/25/2017, 12:03 PM

## 2017-12-25 NOTE — Lactation Note (Signed)
This note was copied from a baby's chart. Lactation Consultation Note  Patient Name: Brenda Lozano [id number]'XToday's Date: 12/25/2017 Reason for consult: Follow-up assessment;Nipple pain/trauma;Difficult latch;Infant weight loss   Follow up with mom of 67 hour old infant. Infant with 10 BF for 10-20 minutes, 1 BF Attempt, 2 voids , 1 stool in the last 24 hours. Infant weight 6 lb 7.9 oz with 10% loss since birth. LATCH scores 7-8.   Mom reports infant is latching a little better, she has not had the same breast tenderness with the last 2 feedings. . Mom reports she feeds infant bundled, enc mom to feed infant STS for better latch and positioning. Advised mom to call out for next feeding for feeding assessment.   Mom is using EBM and comfort gels for nipples. Mom has coconut oil to take home, mom is aware not to use right before comfort gels.   Due to weight loss, decreased output and sore nipples, enc mom to pump once she gets home post BF and supplement infant with all EBM via spoon. Mom voiced understanding. Mom has Ahmeda pump at home.   Reviewed I/O, signs of dehydration in the infant, engorgement prevention/treatment and breast milk expression and storage.   Infant with follow up tomorrow with Ped. Mom aware of OP services, BF support groups and LC phone #. Mom is to call back to make OP appt as needed, she did not want to schedule today.   Mom to call out for next feeding. Mom without further questions/concerns at this time.      Maternal Data Formula Feeding for Exclusion: No Has patient been taught Hand Expression?: Yes Does the patient have breastfeeding experience prior to this delivery?: Yes  Feeding Feeding Type: Breast Fed  LATCH Score Latch: Grasps breast easily, tongue down, lips flanged, rhythmical sucking.  Audible Swallowing: A few with stimulation  Type of Nipple: Everted at rest and after stimulation  Comfort (Breast/Nipple): Soft / non-tender  Hold  (Positioning): Assistance needed to correctly position infant at breast and maintain latch.  LATCH Score: 8  Interventions    Lactation Tools Discussed/Used     Consult Status Consult Status: Follow-up Date: 12/26/17 Follow-up type: In-patient    Silas FloodSharon S Fread Kottke 12/25/2017, 10:28 AM

## 2018-01-16 ENCOUNTER — Encounter (HOSPITAL_COMMUNITY): Payer: Self-pay | Admitting: *Deleted

## 2019-01-10 ENCOUNTER — Ambulatory Visit
Admission: EM | Admit: 2019-01-10 | Discharge: 2019-01-10 | Disposition: A | Discharge status: Home / Self Care | Payer: Federal, State, Local not specified - PPO | Attending: Family Medicine | Admitting: Family Medicine

## 2019-01-10 ENCOUNTER — Encounter: Payer: Self-pay | Admitting: Emergency Medicine

## 2019-01-10 DIAGNOSIS — H66001 Acute suppurative otitis media without spontaneous rupture of ear drum, right ear: Secondary | ICD-10-CM | POA: Diagnosis not present

## 2019-01-10 MED ORDER — AMOXICILLIN 500 MG PO CAPS: 500.0000 mg | ORAL_CAPSULE | Freq: Two times a day (BID) | ORAL | 0 refills | Status: AC

## 2019-01-10 NOTE — ED Notes (Signed)
Formatting of this note might be different from the original.  Patient able to ambulate independently    Electronically signed by Aaron Edelman, RN at 01/10/2019  7:59 PM EST

## 2019-01-10 NOTE — ED Provider Notes (Signed)
Formatting of this note is different from the original.  Images from the original note were not included.    [facility]    [id number]  01/10/19 Arrival Time: 1912    CC: URI symptoms     SUBJECTIVE:  History from: patient.    Leah Huff is a 33 y.o. female who presents with runny nose, congestion, ear pain, and cough x 1 week, with worsening ear pain and cough x 1 day.  Admits to sick exposure to daughter with similar symptoms.  Has tried OTC medications without relief.  Symptoms are made worse at night.  Reports previous symptoms in the past and diagnosed with ear infection.   Complains of associated fever, with tmax of 100.4, and productive cough with streaks of blood.  Denies chills, fatigue, sinus pain, sore throat, SOB, wheezing, chest pain, nausea, changes in bowel or bladder habits.      ROS: As per HPI.    Past Medical History:   Diagnosis Date   ? Anxiety     zoloft   ? Hyperchloremia     H/C   ? No pertinent past medical history      Past Surgical History:   Procedure Laterality Date   ? CESAREAN SECTION  12/26/2011    Procedure: CESAREAN SECTION;  Surgeon: Esmeralda Arthur, MD;  Location: [facility];  Service: Gynecology;  Laterality: N/A;   ? CESAREAN SECTION N/A 12/22/2017    Procedure: CESAREAN SECTION;  Surgeon: Silverio Lay, MD;  Location: [facility]IRTH[facility] SU[facility];  Service: Obstetrics;  Laterality: N/A;   ? WISDOM TOOTH EXTRACTION       Allergies   Allergen Reactions   ? Fish-Derived Products      Patient is allergic to catfish and porta bella mushrooms   ? Mushroom Extract Complex    ? Tape Hives and Itching     PT IS ALLERGIC TO ADHESIVE TAPE     No current facility-administered medications on file prior to encounter.      Current Outpatient Medications on File Prior to Encounter   Medication Sig Dispense Refill   ? Multiple Vitamins-Minerals (WOMENS DAILY FORMULA PO) Take by mouth.     ? ibuprofen (ADVIL,MOTRIN) 600 MG tablet Take 1 tablet (600 mg total) by mouth every 6 (six) hours as  needed. 30 tablet 2   ? loratadine (CLARITIN) 10 MG tablet Take 10 mg by mouth daily.     ? oxyCODONE-acetaminophen (PERCOCET/ROXICET) 5-325 MG tablet Take 1 tablet by mouth every 6 (six) hours as needed (pain scale 4-7). 12 tablet 0     Social History     Socioeconomic History   ? Marital status: Married     Spouse name: Not on file   ? Number of children: Not on file   ? Years of education: Not on file   ? Highest education level: Not on file   Occupational History   ? Not on file   Social Needs   ? Financial resource strain: Not on file   ? Food insecurity:     Worry: Not on file     Inability: Not on file   ? Transportation needs:     Medical: Not on file     Non-medical: Not on file   Tobacco Use   ? Smoking status: Never Smoker   ? Smokeless tobacco: Never Used   Substance and Sexual Activity   ? Alcohol use: Yes     Alcohol/week: 2.0 standard drinks  Types: 2 Standard drinks or equivalent per week     Comment: glass of wine sometimes   ? Drug use: No   ? Sexual activity: Yes     Partners: Male     Comment: MIRENA   Lifestyle   ? Physical activity:     Days per week: Not on file     Minutes per session: Not on file   ? Stress: Not on file   Relationships   ? Social connections:     Talks on phone: Not on file     Gets together: Not on file     Attends religious service: Not on file     Active member of club or organization: Not on file     Attends meetings of clubs or organizations: Not on file     Relationship status: Not on file   ? Intimate partner violence:     Fear of current or ex partner: Not on file     Emotionally abused: Not on file     Physically abused: Not on file     Forced sexual activity: Not on file   Other Topics Concern   ? Not on file   Social History Narrative   ? Not on file     Family History   Problem Relation Age of Onset   ? Asthma Mother    ? Hypertension Mother    ? Mental illness Mother    ? Thyroid disease Mother    ? Anxiety disorder Mother    ? Hypertension Maternal  Grandmother    ? Cancer Maternal Grandmother    ? Diabetes Maternal Uncle      OBJECTIVE:    Vitals:    01/10/19 1923   BP: 129/87   Pulse: 97   Resp: 18   Temp: 99.6 F (37.6 C)   TempSrc: Oral   SpO2: 97%     General appearance: alert; appears fatigued, but nontoxic; speaking in full sentences and tolerating own secretions  HEENT: NCAT; Ears: EACs clear, LT TM pearly gray, RT TM erythematous; Eyes: PERRL.  EOM grossly intact. Nose: nares patent without rhinorrhea, Throat: oropharynx clear, tonsils non erythematous or enlarged, uvula midline   Neck: supple without LAD  Lungs: unlabored respirations, symmetrical air entry; cough: absent; no respiratory distress; CTAB  Heart: regular rate and rhythm.  Radial pulses 2+ symmetrical bilaterally  Skin: warm and dry  Psychological: alert and cooperative; normal mood and affect    ASSESSMENT & PLAN:    1. Non-recurrent acute suppurative otitis media of right ear without spontaneous rupture of tympanic membrane      Meds ordered this encounter   Medications   ? amoxicillin (AMOXIL) 500 MG capsule     Sig: Take 1 capsule (500 mg total) by mouth 2 (two) times daily for 10 days.     Dispense:  20 capsule     Refill:  0     Order Specific Question:   Supervising Provider     Answer:   Eustace MooreELSON, YVONNE SUE [[id number]]     Rest and drink plenty of fluids  Prescribed amoxicillin  Take medication as directed and to completion  Continue to use OTC ibuprofen and/ or tylenol as needed for pain control  Follow up with PCP if symptoms persists  Return here or go to the ER if you have any new or worsening symptoms fever, chills, nausea, vomiting, worsening ear pain, discharge, changes in hearing, etc...    Reviewed  expectations re: course of current medical issues. Questions answered.  Outlined signs and symptoms indicating need for more acute intervention.  Patient verbalized understanding.  After Visit Summary given.      Rennis Harding, PA-C  01/10/19 2017    Electronically signed by  Rennis Harding, PA-C at 01/10/2019  8:17 PM EST

## 2019-01-10 NOTE — ED Triage Notes (Signed)
Formatting of this note might be different from the original.  Pt presents to [facility] for assessment of URI symptoms x 1 week, but worsening cough yesterday and today, bleed in her phlegm, 100.4 temp prior to coming.  States daughter had pneumonia and an ear infection currently.    Electronically signed by Aaron Edelman, RN at 01/10/2019  7:22 PM EST

## 2019-01-10 NOTE — Discharge Instructions (Addendum)
Rest and drink plenty of fluids Prescribed amoxicillin Take medications as directed and to completion Continue to use OTC ibuprofen and/ or tylenol as needed for pain control Follow up with PCP if symptoms persists Return here or go to the ER if you have any new or worsening symptoms fever, chills, nausea, vomiting, worsening ear pain, discharge, changes in hearing, etc..Marland Kitchen

## 2019-01-10 NOTE — ED Notes (Signed)
Patient able to ambulate independently  

## 2019-01-10 NOTE — ED Triage Notes (Signed)
Pt presents to [facility] for assessment of URI symptoms x 1 week, but worsening cough yesterday and today, bleed in her phlegm, 100.4 temp prior to coming.  States daughter had pneumonia and an ear infection currently.

## 2019-01-10 NOTE — ED Provider Notes (Signed)
[facility]   [id number] 01/10/19 Arrival Time: 1912   CC: URI symptoms   SUBJECTIVE: History from: patient.  Brenda Lozano is a 33 y.o. female who presents with runny nose, congestion, ear pain, and cough x 1 week, with worsening ear pain and cough x 1 day.  Admits to sick exposure to daughter with similar symptoms.  Has tried OTC medications without relief.  Symptoms are made worse at night.  Reports previous symptoms in the past and diagnosed with ear infection.   Complains of associated fever, with tmax of 100.4, and productive cough with streaks of blood.  Denies chills, fatigue, sinus pain, sore throat, SOB, wheezing, chest pain, nausea, changes in bowel or bladder habits.    ROS: As per HPI.  Past Medical History:  Diagnosis Date  . Anxiety    zoloft  . Hyperchloremia    H/C  . No pertinent past medical history    Past Surgical History:  Procedure Laterality Date  . CESAREAN SECTION  12/26/2011   Procedure: CESAREAN SECTION;  Surgeon: Esmeralda Arthur, MD;  Location: [facility];  Service: Gynecology;  Laterality: N/A;  . CESAREAN SECTION N/A 12/22/2017   Procedure: CESAREAN SECTION;  Surgeon: Silverio Lay, MD;  Location: [facility] SU[facility];  Service: Obstetrics;  Laterality: N/A;  . WISDOM TOOTH EXTRACTION     Allergies  Allergen Reactions  . Fish-Derived Products     Patient is allergic to catfish and porta bella mushrooms  . Mushroom Extract Complex   . Tape Hives and Itching    PT IS ALLERGIC TO ADHESIVE TAPE   No current facility-administered medications on file prior to encounter.    Current Outpatient Medications on File Prior to Encounter  Medication Sig Dispense Refill  . Multiple Vitamins-Minerals (WOMENS DAILY FORMULA PO) Take by mouth.    Marland Kitchen ibuprofen (ADVIL,MOTRIN) 600 MG tablet Take 1 tablet (600 mg total) by mouth every 6 (six) hours as needed. 30 tablet 2  . loratadine (CLARITIN) 10 MG tablet Take 10 mg by mouth daily.    Marland Kitchen  oxyCODONE-acetaminophen (PERCOCET/ROXICET) 5-325 MG tablet Take 1 tablet by mouth every 6 (six) hours as needed (pain scale 4-7). 12 tablet 0   Social History   Socioeconomic History  . Marital status: Married    Spouse name: Not on file  . Number of children: Not on file  . Years of education: Not on file  . Highest education level: Not on file  Occupational History  . Not on file  Social Needs  . Financial resource strain: Not on file  . Food insecurity:    Worry: Not on file    Inability: Not on file  . Transportation needs:    Medical: Not on file    Non-medical: Not on file  Tobacco Use  . Smoking status: Never Smoker  . Smokeless tobacco: Never Used  Substance and Sexual Activity  . Alcohol use: Yes    Alcohol/week: 2.0 standard drinks    Types: 2 Standard drinks or equivalent per week    Comment: glass of wine sometimes  . Drug use: No  . Sexual activity: Yes    Partners: Male    Comment: MIRENA  Lifestyle  . Physical activity:    Days per week: Not on file    Minutes per session: Not on file  . Stress: Not on file  Relationships  . Social connections:    Talks on phone: Not on file    Gets together: Not  on file    Attends religious service: Not on file    Active member of club or organization: Not on file    Attends meetings of clubs or organizations: Not on file    Relationship status: Not on file  . Intimate partner violence:    Fear of current or ex partner: Not on file    Emotionally abused: Not on file    Physically abused: Not on file    Forced sexual activity: Not on file  Other Topics Concern  . Not on file  Social History Narrative  . Not on file   Family History  Problem Relation Age of Onset  . Asthma Mother   . Hypertension Mother   . Mental illness Mother   . Thyroid disease Mother   . Anxiety disorder Mother   . Hypertension Maternal Grandmother   . Cancer Maternal Grandmother   . Diabetes Maternal Uncle      OBJECTIVE:  Vitals:   01/10/19 1923  BP: 129/87  Pulse: 97  Resp: 18  Temp: 99.6 F (37.6 C)  TempSrc: Oral  SpO2: 97%    General appearance: alert; appears fatigued, but nontoxic; speaking in full sentences and tolerating own secretions HEENT: NCAT; Ears: EACs clear, LT TM pearly gray, RT TM erythematous; Eyes: PERRL.  EOM grossly intact. Nose: nares patent without rhinorrhea, Throat: oropharynx clear, tonsils non erythematous or enlarged, uvula midline  Neck: supple without LAD Lungs: unlabored respirations, symmetrical air entry; cough: absent; no respiratory distress; CTAB Heart: regular rate and rhythm.  Radial pulses 2+ symmetrical bilaterally Skin: warm and dry Psychological: alert and cooperative; normal mood and affect  ASSESSMENT & PLAN:  1. Non-recurrent acute suppurative otitis media of right ear without spontaneous rupture of tympanic membrane     Meds ordered this encounter  Medications  . amoxicillin (AMOXIL) 500 MG capsule    Sig: Take 1 capsule (500 mg total) by mouth 2 (two) times daily for 10 days.    Dispense:  20 capsule    Refill:  0    Order Specific Question:   Supervising Provider    Answer:   Eustace Moore [[id number]]   Rest and drink plenty of fluids Prescribed amoxicillin Take medication as directed and to completion Continue to use OTC ibuprofen and/ or tylenol as needed for pain control Follow up with PCP if symptoms persists Return here or go to the ER if you have any new or worsening symptoms fever, chills, nausea, vomiting, worsening ear pain, discharge, changes in hearing, etc...  Reviewed expectations re: course of current medical issues. Questions answered. Outlined signs and symptoms indicating need for more acute intervention. Patient verbalized understanding. After Visit Summary given.         Rennis Harding, PA-C 01/10/19 2017

## 2019-03-27 NOTE — Progress Notes (Signed)
Formatting of this note might be different from the original.  Prelabs ordered.  Electronically signed by Deirdre Pippins, NP at 03/27/2019  5:11 PM EDT

## 2021-02-25 NOTE — Progress Notes (Signed)
Formatting of this note is different from the original.  Start Time: 6:01 PM    [facility] follow up appointment number:4thfollow up     Initial [facility] Weight: 248.8#Initial [facility] BMI: 41.40  Today's Weight 253lbs Today's BMI 42    Wt Readings from Last 3 Encounters:   01/07/21 250 lb 4.8 oz (113.5 kg)   12/24/20 253 lb 8 oz (115 kg)   12/03/20 253 lb 8 oz (115 kg)       Change in weight since last [facility] Appointment +2.5lbs  [facility] Initial weight goal Lose 12 lb   Progress no    24 Hour Recall:  Reviewed CL log    Last appointments goals:   1.) Drink 64oz of water daily  2.) maintain walking and add strength training 2 days per week  3.) Continue to log and cut down on saturated fat.    New Goals:  1.) Drink 64oz of water  2.) Exercise as able  3.) Log for self accountability     Patient update: Patient will be moving soon, so this will be her last visit with [place]. Spent some time talking about some fundamentals for independence and accountability for health goals. Drink 64oz of water. Reviewed a few days logged on CL app. Provided education and advice for moving forward without CL.       Motivation/self-efficacy: action stage of change    End Time: 6:23 PM    Electronically signed by Balinda Quails, RD, LDN at 02/25/2021  7:06 PM EDT

## 2021-06-24 NOTE — Progress Notes (Signed)
Formatting of this note is different from the original.  Images from the original note were not included.      Patient: Leah Huff  DOB: Oct 31, 1986, age 35 y.o.  MRN: [id number]    PCP: Benny Lennert, PA    Assessment and Plan     1. Anxiety  escitalopram oxalate (LEXAPRO) 10 mg tablet     Continue Lexapro at same dosages.  Reminded patient to communicate with me through MyChart if she starts to run out so she does not go without medications.    Follow up in about 3 months (around 09/24/2021) for mental health.    Place of service: patient home    Patient has been advised as to the limitations and limited nature of physical exam due to nature of a VIDEO/TELPHONIC visit, the possibility of privacy risk in the use of a video visit, and that the healthcare provider may recommend visiting a healthcare clinic for in-person care and follow up.    Risks, benefits, and alternatives of the medications and treatment plan prescribed today were discussed, and patient expressed understanding. Plan follow-up as discussed or as needed if any worsening symptoms or change in condition.  Patient voiced understanding of the treatment plan and agreed to attempt to comply.    See after visit summary for patient specific instructions.      Patient's Medications        Accurate as of June 24, 2021 12:36 PM. Reflects encounter med changes as of last refresh         Continued Medications      Instructions   cetirizine 10 mg tablet  Commonly known as: ZYRTEC   10 mg, Oral, Take 10 mg by mouth once daily    clonazePAM 0.5 mg tablet  Commonly known as: KLONOPIN   0.25-0.5 mg, Oral, At bedtime    escitalopram oxalate 10 mg tablet  Commonly known as: LEXAPRO   10 mg, Oral, Daily    fluticasone propionate 50 mcg/actuation nasal spray  Commonly known as: FLONASE   USE 2 SPRAYS IN EACH NOSTRIL ONCE A DAY    OMEGA 3-6-9 FATTY ACIDS PO   No dose, route, or frequency recorded.    PROBIOTIC DAILY PO   1 tablet, Oral, Daily, nutriflora        Discontinued Medications    glucosamine-chondroitin 500-400 mg per tablet  Stopped by: Benny Lennert, PA    Phentermine HCl 8 MG Tabs tablet  Commonly known as: PRO-FAST,LOMAIRA  Stopped by: Benny Lennert, PA    PRENATAL 28-0.8 MG Tabs  Stopped by: Benny Lennert, PA    topiramate 25 MG tablet  Commonly known as: TOPAMAX  Stopped by: Benny Lennert, PA          Subjective     Leah Huff is a 35 y.o. female who presents with:     Patient presents with   ? Anxiety       Pt presents via Zoom video for recheck mental health and medication management.  Overall she is doing really well.  Moved to her new job has resulted in less stress, less time in the car.  Her family has now joined her which has been nice.  She stopped all medications for weight loss and is hoping now that she has an additional hour and a half of each day due to not driving to work she will be able to better meal plan and eat healthier.  Her anxiety has been slightly  increased recently because she has been irregular taking her Lexapro because she was concerned that she was going to run out prior to her appointment.  She feels like the dose of Lexapro 10 mg is an effective dose for her current anxiety levels.    History was obtained from patient from her home in [place].     Reviewed and updated this visit by provider:  Tobacco  Allergies  Meds  Problems  Med Hx  Surg Hx  Fam Hx            Review of Systems   Psychiatric/Behavioral: The patient is nervous/anxious.        Objective   This visit was done via video through Zoom.  Examination conducted with the use of video cameras/computer monitors. Vital signs and other aspects of physical exam are limited due to the nature of this encounter.    Physical Exam  Constitutional:       Appearance: Normal appearance.   HENT:      Head: Normocephalic and atraumatic.      Right Ear: External ear normal.      Left Ear: External ear normal.   Eyes:      Conjunctiva/sclera: Conjunctivae normal.    Musculoskeletal:      Cervical back: Normal range of motion.   Pulmonary:      Effort: Pulmonary effort is normal.      Comments: Able to talk in full sentences without difficulty.  Neurological:      Mental Status: She is alert and oriented to person, place, and time.   Psychiatric:         Mood and Affect: Mood normal.         Behavior: Behavior normal.         Thought Content: Thought content normal.         Judgment: Judgment normal.         Please note: Portions of this report may have been transcribed using dragon voice recognition software. Every effort was made to ensure accuracy; however, inadvertent computerized transcription errors may be present.    [facility]  [facility]    12:36 PM      Electronically signed by Benny Lennert, PA at 06/24/2021 12:36 PM EDT

## 2021-11-02 NOTE — Progress Notes (Signed)
Formatting of this note might be different from the original.  This patient's chart has been reviewed by a [facility] Specialist.    Attempted to contact patient in order to discuss appointments and screenings due for the upcoming year.     Left message and Sent MyChart message with recommendations and contact information.    Additional Comments:     Electronically signed by Seward Carol at 11/02/2021  8:35 AM EST

## 2022-09-12 NOTE — Telephone Encounter (Addendum)
Called and got pt scheduled     ----- Message from Minus Breeding sent at 09/12/2022  2:46 PM EDT -----  Subject: Appointment Request    Reason for Call: New Patient/New to Provider Appointment needed: Routine   Physical Exam    QUESTIONS    Reason for appointment request? Requested Provider unavailable - Berdie Ogren     Additional Information for Provider? Patient would like to establish with   Berdie Ogren for annual physical-no appointments available  ---------------------------------------------------------------------------  --------------  Rod Can INFO  [phone]; OK to leave message on voicemail  ---------------------------------------------------------------------------  --------------  SCRIPT ANSWERS

## 2022-12-28 ENCOUNTER — Ambulatory Visit: Admit: 2022-12-28 | Discharge: 2022-12-28 | Payer: BLUE CROSS/BLUE SHIELD | Attending: Family | Primary: Family

## 2022-12-28 DIAGNOSIS — Z0000 Encounter for general adult medical examination without abnormal findings: Secondary | ICD-10-CM

## 2022-12-28 DIAGNOSIS — Z13220 Encounter for screening for lipoid disorders: Secondary | ICD-10-CM

## 2022-12-28 MED ORDER — ESCITALOPRAM OXALATE 10 MG PO TABS
10 MG | ORAL_TABLET | Freq: Every day | ORAL | 1 refills | Status: DC
Start: 2022-12-28 — End: 2023-09-08

## 2022-12-28 MED ORDER — KETOCONAZOLE 2 % EX SHAM
2 % | CUTANEOUS | 3 refills | Status: AC
Start: 2022-12-28 — End: 2023-07-06

## 2022-12-28 NOTE — Progress Notes (Unsigned)
Chief Complaint   Patient presents with    New Patient    Establish Care     Moved from [place] a year and half ago Leah Huff     PHQ-9 Total Score: 0 (12/28/2022 11:19 AM)    "Have you been to the ER, urgent care clinic since your last visit?  Hospitalized since your last visit?"    NO    "Have you seen or consulted any other health care providers outside of [facility] since your last visit?"    NO        "Have you had a pap smear?"    YES - Where[facility]. Nurse/CMA to request most recent records if not in the chart           There were no vitals taken for this visit.       No data to display

## 2022-12-28 NOTE — Progress Notes (Unsigned)
12/28/2022    Leah Huff (DOB:  05-25-1986) is a 37 y.o. female, here for a preventive medicine evaluation.  Her chronic conditions include obesity with a BMI of 41.10%, seasonal allergies, anxiety and depression, seborrheic dermatitis of the scalp, and she has a family history of diabetes and is currently under the care of a podiatrist secondary to a left ankle (Dr. Frazier Richards).  She is adherent with her medication regimen but does not monitor her blood sugar at home.  She is currently also under the care of of her OB/GYN who is waiting for her to have her Reginal Lutes (Dr. Shelva Majestic) and she has a history of an abnormal Pap and has a colposcopy scheduled for 01/10/2023.  She also states she has lost about 16 pounds since she started the [facility].    Patient Active Problem List   Diagnosis    Encounter for wellness examination in adult    Family history of diabetes mellitus    Family history of thyroid disease    Arthralgia of right elbow    Arthralgia of both knees       Review of Systems   Constitutional:         BMI 41.10%   Respiratory:          Seasonal allergies   Endocrine:        Family history of diabetes and hypothyroidism   Genitourinary:         History of abnormal Pap   Musculoskeletal:         History of left ankle injury   Skin:         Seborrheic dermatitis of the scalp   Psychiatric/Behavioral:          Anxiety and depression       Prior to Visit Medications    Medication Sig Taking? Authorizing Provider   PROBIOTIC PRODUCT PO Take 1 tablet by mouth daily Yes [provider]   WEGOVY 1.7 MG/0.75ML SOAJ SC injection  Yes [provider]   loratadine (CLARITIN) 10 MG tablet Take 1 tablet by mouth daily Yes [provider]   escitalopram (LEXAPRO) 10 MG tablet Take 1 tablet by mouth daily Yes Carroll Kinds, APRN - NP   ketoconazole (NIZORAL) 2 % shampoo Shampoo into hair once daily x 2-4 wks Yes Carroll Kinds, APRN - NP        Allergies   Allergen Reactions    Adhesive Tape  Hives and Itching     PT IS ALLERGIC TO ADHESIVE TAPE    Silicone Hives and Itching     PT IS ALLERGIC TO ADHESIVE TAPE    Fish Allergy Diarrhea     Patient is allergic to catfish and porta bella mushrooms       Past Medical History:   Diagnosis Date    Abnormal vaginal cytology     Anxiety        Past Surgical History:   Procedure Laterality Date    CESAREAN SECTION         Social History     Socioeconomic History    Marital status: Married     Spouse name: Not on file    Number of children: Not on file    Years of education: Not on file    Highest education level: Not on file   Occupational History    Not on file   Tobacco Use    Smoking status: Never    Smokeless tobacco: Never  Vaping Use    Vaping Use: Never used   Substance and Sexual Activity    Alcohol use: Yes    Drug use: Never    Sexual activity: Yes     Partners: Male   Other Topics Concern    Not on file   Social History Narrative    Not on file     Social Determinants of Health     Financial Resource Strain: Low Risk  (12/28/2022)    Overall Financial Resource Strain (CARDIA)     Difficulty of Paying Living Expenses: Not hard at all   Food Insecurity: No Food Insecurity (12/28/2022)    Hunger Vital Sign     Worried About Running Out of Food in the Last Year: Never true     Ran Out of Food in the Last Year: Never true   Transportation Needs: Unknown (12/28/2022)    PRAPARE - Therapist, art (Medical): Not on file     Lack of Transportation (Non-Medical): No   Physical Activity: Not on file   Stress: Not on file   Social Connections: Not on file   Intimate Partner Violence: Not on file   Housing Stability: Unknown (12/28/2022)    Housing Stability Vital Sign     Unable to Pay for Housing in the Last Year: Not on file     Number of Places Lived in the Last Year: Not on file     Unstable Housing in the Last Year: No        Family History   Problem Relation Age of Onset    Thyroid Disease Mother     Hypertension Mother     Cancer Maternal  Grandmother        ADVANCE DIRECTIVE: N, <no information>    Vitals:    12/28/22 1120   BP: 132/80   Site: Left Upper Arm   Position: Sitting   Cuff Size: Large Adult   Pulse: 89   Resp: 16   SpO2: 97%   Weight: 112 kg (247 lb)   Height: 1.651 m (5\' 5" )     Estimated body mass index is 41.1 kg/m as calculated from the following:    Height as of this encounter: 1.651 m (5\' 5" ).    Weight as of this encounter: 112 kg (247 lb).    Physical Exam  Vitals (BMI 40.10%) and nursing note reviewed.   HENT:      Head: Normocephalic.   Cardiovascular:      Heart sounds: Normal heart sounds.   Pulmonary:      Effort: Pulmonary effort is normal.      Breath sounds: Normal breath sounds.   Abdominal:      General: Bowel sounds are normal.      Palpations: Abdomen is soft.   Skin:     General: Skin is warm and dry.   Neurological:      Mental Status: She is oriented to person, place, and time.   Psychiatric:         Mood and Affect: Mood normal.         Behavior: Behavior normal.         Thought Content: Thought content normal.         Judgment: Judgment normal.             Latest Ref Rng & Units 12/28/2022    11:52 AM   LAB PRIMARY CARE   A1C 4.8 - 5.6 %  5.7    A1C POC 4.8 - 5.6 % 5.7    GLU random 70 - 99 mg/dL 82    CHOL 409 - 811 mg/dL 914    TRIG 0 - 782 mg/dL 956    HDL >21 mg/dL 42    LDL CALC 0 - 99 mg/dL 78    NA 308 - 657 mmol/L 139    K 3.5 - 5.2 mmol/L 3.9    BUN 6 - 20 mg/dL 6    CR 8.46 - 9.62 mg/dL 9.52    CA 8.7 - 84.1 mg/dL 9.4    ALT 0 - 32 IU/L 35    AST 0 - 40 IU/L 22    TSH 0.450 - 4.500 uIU/mL 1.110    HGB 11.1 - 15.9 g/dL 32.4        Lab Results   Component Value Date/Time    CHOL 147 12/28/2022 11:52 AM    TRIG 154 12/28/2022 11:52 AM    HDL 42 12/28/2022 11:52 AM    LDLCALC 78 12/28/2022 11:52 AM    GLUCOSE 82 12/28/2022 11:52 AM    LABA1C 5.7 12/28/2022 11:52 AM       The ASCVD Risk score (Arnett DK, et al., 2019) failed to calculate for the following reasons:    The 2019 ASCVD risk score is only valid for  ages 84 to 13    Immunization History   Administered Date(s) Administered    COVID-19, PFIZER PURPLE top, DILUTE for use, (age 56 y+), 87mcg/0.3mL 11/05/2019, 11/27/2019, 08/26/2020    Influenza Virus Vaccine 10/20/2018    Influenza, FLUBLOK, (age 21 y+), PF, 0.53mL 08/28/2019    TDaP, ADACEL (age 53y-64y), Leda Min (age 10y+), IM, 0.72mL 12/27/2011, 03/20/2017    Varicella, VARIVAX, (age 55m+), SC, 0.51mL 05/08/2018       Health Maintenance   Topic Date Due    Hepatitis B vaccine (1 of 3 - 3-dose series) Never done    Varicella vaccine (2 of 2 - 13+ 2-dose series) 06/05/2018    Cervical cancer screen  Never done    Flu vaccine (1) 06/21/2022    COVID-19 Vaccine (4 - 2023-24 season) 07/22/2022    A1C test (Diabetic or Prediabetic)  12/29/2023    Depression Screen  12/29/2023    DTaP/Tdap/Td vaccine (3 - Td or Tdap) 03/21/2027    Hepatitis C screen  Completed    HIV screen  Completed    Hepatitis A vaccine  Aged Out    Hib vaccine  Aged Out    HPV vaccine  Aged Out    Polio vaccine  Aged Out    Meningococcal (ACWY) vaccine  Aged Out    Pneumococcal 0-64 years Vaccine  Aged Out       Assessment & Plan   Encounter for wellness examination in adult  We are making sure that her health screenings are done in a timely fashion.  They are as documented in the EMR/HPI    Screening cholesterol level  -     Lipid Panel   obtaining updated lipid panel for trending and will make treatment decisions when I get the results    Encounter for hepatitis C screening test for low risk patient  -     Hepatitis C Ab, Rflx to Qt by PCR  Screening for hep C and will refer to gastroenterology for further evaluation and treatment with confirmation with reflex    Encounter for screening for HIV  -  HIV 1/2 Ag/Ab, 4TH Generation,W Rflx Confirm  Screening for HIV and will refer to infectious disease specialist with positive disease testing    Family history of diabetes mellitus  -     Hemoglobin A1C  Obtaining updated A1c for trending and will  make treatment decisions when I get the results    Screening for deficiency anemia  -     CBC with Auto Differential  Obtaining updated CBC for trending and will make treatment decisions when I get the results    Encounter for screening for other metabolic disorders  -     Comprehensive Metabolic Panel  Obtaining updated CMP for trending and will make treatment decisions when I get the results    Family history of thyroid disease  -     Thyroid Cascade Profile  Obtaining updated thyroid cascade and will make supplementation decisions when I get the results    Return in about 6 months (around 06/28/2023) for follow up.         --Carroll Kinds, APRN - NP

## 2022-12-29 LAB — HEPATITIS C AB, RFLX TO QT BY PCR: Hepatitis C Ab: NONREACTIVE

## 2022-12-29 LAB — CBC WITH AUTO DIFFERENTIAL
Basophils %: 1 %
Basophils Absolute: 0 10*3/uL (ref 0.0–0.2)
Eosinophils %: 3 %
Eosinophils Absolute: 0.2 10*3/uL (ref 0.0–0.4)
Hematocrit: 41.8 % (ref 34.0–46.6)
Hemoglobin: 13.5 g/dL (ref 11.1–15.9)
Immature Grans (Abs): 0 10*3/uL (ref 0.0–0.1)
Immature Granulocytes %: 0 %
Lymphocytes %: 44 %
Lymphocytes Absolute: 2.9 10*3/uL (ref 0.7–3.1)
MCH: 28 pg (ref 26.6–33.0)
MCHC: 32.3 g/dL (ref 31.5–35.7)
MCV: 87 fL (ref 79–97)
Monocytes %: 6 %
Monocytes Absolute: 0.4 10*3/uL (ref 0.1–0.9)
Neutrophils %: 46 %
Neutrophils Absolute: 3.1 10*3/uL (ref 1.4–7.0)
Platelets: 360 10*3/uL (ref 150–450)
RBC: 4.83 x10E6/uL (ref 3.77–5.28)
RDW: 13.1 % (ref 11.7–15.4)
WBC: 6.7 10*3/uL (ref 3.4–10.8)

## 2022-12-29 LAB — COMPREHENSIVE METABOLIC PANEL
ALT: 35 IU/L — ABNORMAL HIGH (ref 0–32)
AST: 22 IU/L (ref 0–40)
Albumin/Globulin Ratio: 1.8 (ref 1.2–2.2)
Albumin: 4.4 g/dL (ref 3.9–4.9)
Alkaline Phosphatase: 69 IU/L (ref 44–121)
BUN/Creatinine Ratio: 7 — ABNORMAL LOW (ref 9–23)
BUN: 6 mg/dL (ref 6–20)
CO2: 25 mmol/L (ref 20–29)
Calcium: 9.4 mg/dL (ref 8.7–10.2)
Chloride: 102 mmol/L (ref 96–106)
Creatinine: 0.83 mg/dL (ref 0.57–1.00)
Est, Glomerular Filtration Rate: 95 mL/min/{1.73_m2} (ref >59)
Globulin, Total: 2.5 g/dL (ref 1.5–4.5)
Glucose: 82 mg/dL (ref 70–99)
Potassium: 3.9 mmol/L (ref 3.5–5.2)
Sodium: 139 mmol/L (ref 134–144)
Total Bilirubin: 0.2 mg/dL (ref 0.0–1.2)
Total Protein: 6.9 g/dL (ref 6.0–8.5)

## 2022-12-29 LAB — HIV 1/2 AG/AB, 4TH GENERATION,W RFLX CONFIRM: HIV Screen 4th Generation wRfx: NONREACTIVE

## 2022-12-29 LAB — LIPID PANEL
Cholesterol: 147 mg/dL (ref 100–199)
HDL: 42 mg/dL (ref >39)
LDL Calculated: 78 mg/dL (ref 0–99)
Triglycerides: 154 mg/dL — ABNORMAL HIGH (ref 0–149)
VLDL Cholesterol Calculated: 27 mg/dL (ref 5–40)

## 2022-12-29 LAB — HEMOGLOBIN A1C: Hemoglobin A1C: 5.7 % — ABNORMAL HIGH (ref 4.8–5.6)

## 2022-12-29 LAB — INTERPRETATION

## 2022-12-29 LAB — THYROID CASCADE PROFILE: TSH: 1.11 u[IU]/mL (ref 0.450–4.500)

## 2023-01-02 NOTE — Other (Signed)
Patient Verbalized understanding of Provider's notes and labs

## 2023-01-10 DIAGNOSIS — Z0000 Encounter for general adult medical examination without abnormal findings: Secondary | ICD-10-CM

## 2023-04-25 NOTE — Telephone Encounter (Signed)
Pt called in regards to having issues with her allergies recently.Pt stated she has been sneezing, experiencing dry nose, and a nose bleed that was heavier than she has every experienced. Pt stated she normally takes Allegra, but it has not been working. Pt would like to know of any recommendations that could possibly help alleviate the symptoms.

## 2023-04-25 NOTE — Telephone Encounter (Signed)
Patient advised.  She stated that she is having more of an issue with dry nose and I advised her to try the saline nasal spray to help moisten her nares and that may help with the nose bleeds as well.  Patient stated that she may also switch her allergy medication as well.

## 2023-07-06 NOTE — Telephone Encounter (Signed)
Patient called in requesting another prescription for her Ketocanozale (Nizoral) 2 % Shampoo. She would like for it to be sent to the [facility] in [place] [address].       Her next appt is on 08/18/2023 with NP Franchot Mimes

## 2023-07-08 MED ORDER — KETOCONAZOLE 2 % EX SHAM
2 % | CUTANEOUS | 0 refills | Status: DC
Start: 2023-07-08 — End: 2023-09-08

## 2023-08-18 ENCOUNTER — Encounter: Payer: BLUE CROSS/BLUE SHIELD | Primary: Family

## 2023-09-08 ENCOUNTER — Ambulatory Visit: Admit: 2023-09-08 | Discharge: 2023-09-08 | Payer: BLUE CROSS/BLUE SHIELD | Primary: Family

## 2023-09-08 DIAGNOSIS — F419 Anxiety disorder, unspecified: Secondary | ICD-10-CM

## 2023-09-08 MED ORDER — ESCITALOPRAM OXALATE 10 MG PO TABS
10 | ORAL_TABLET | Freq: Every day | ORAL | 3 refills | Status: DC
Start: 2023-09-08 — End: 2024-09-20

## 2023-09-08 MED ORDER — KETOCONAZOLE 2 % EX SHAM
2 | CUTANEOUS | 10 refills | Status: AC
Start: 2023-09-08 — End: ?

## 2023-09-08 NOTE — Progress Notes (Signed)
Chronic Condition Follow-Up    Subjective  Chief Complaint   Patient presents with    Follow-up Chronic Condition     HPI:  Leah Huff is a 37 y.o. female.  1. Anxiety  Patient states she has taken the Lexapro for a long while and she feels it does well to manage her anxiety. She has been without pills recently so she feels like her stress level has increased.  2. Seborrheic dermatitis  Patient reports flaking to her hairline and some itching. She has not had refills of her shampoo which does help.     Past Medical History:   Diagnosis Date    Abnormal vaginal cytology     Anxiety     Presence of Mirena IUD     Placed six years ago     Past Surgical History:   Procedure Laterality Date    CESAREAN SECTION        Family History   Problem Relation Age of Onset    Thyroid Disease Mother     Hypertension Mother     Cancer Maternal Grandmother      Social History     Socioeconomic History    Marital status: Married     Spouse name: Not on file    Number of children: Not on file    Years of education: Not on file    Highest education level: Not on file   Occupational History    Not on file   Tobacco Use    Smoking status: Never    Smokeless tobacco: Never   Vaping Use    Vaping status: Never Used   Substance and Sexual Activity    Alcohol use: Yes    Drug use: Never    Sexual activity: Yes     Partners: Male   Other Topics Concern    Not on file   Social History Narrative    Not on file     Social Determinants of Health     Financial Resource Strain: Low Risk  (12/28/2022)    Overall Financial Resource Strain (CARDIA)     Difficulty of Paying Living Expenses: Not hard at all   Food Insecurity: No Food Insecurity (12/28/2022)    Hunger Vital Sign     Worried About Running Out of Food in the Last Year: Never true     Ran Out of Food in the Last Year: Never true   Transportation Needs: Unknown (12/28/2022)    PRAPARE - Therapist, art (Medical): Not on file     Lack of Transportation  (Non-Medical): No   Physical Activity: Insufficiently Active (06/22/2021)    Received from [facility], [facility]    Exercise Vital Sign     Days of Exercise per Week: 7 days     Minutes of Exercise per Session: 20 min   Stress: No Stress Concern Present (06/22/2021)    Received from [facility], [facility] - Occupational Stress Questionnaire     Feeling of Stress : Only a little   Social Connections: Unknown (04/04/2022)    Received from [facility], [facility]    Social Network     Social Network: Not on file   Intimate Partner Violence: Unknown (02/24/2022)    Received from [facility], [facility]    HITS     Physically Hurt: Not on file     Insult or Talk Down  To: Not on file     Threaten Physical Harm: Not on file     Scream or Curse: Not on file   Housing Stability: Unknown (12/28/2022)    Housing Stability Vital Sign     Unable to Pay for Housing in the Last Year: Not on file     Number of Places Lived in the Last Year: Not on file     Unstable Housing in the Last Year: No     Current Outpatient Medications on File Prior to Visit   Medication Sig Dispense Refill    WEGOVY 2.4 MG/0.75ML SOAJ SC injection INJECT 2.4 MG SUBCUTANEOUSLY ONCE WEEKLY ON THE SAME DAY EACH WEEK      metFORMIN (GLUCOPHAGE-XR) 500 MG extended release tablet       PROBIOTIC PRODUCT PO Take 1 tablet by mouth daily      loratadine (CLARITIN) 10 MG tablet Take 1 tablet by mouth daily       No current facility-administered medications on file prior to visit.     Allergies   Allergen Reactions    Adhesive Tape Hives and Itching     PT IS ALLERGIC TO ADHESIVE TAPE    Silicone Hives and Itching     PT IS ALLERGIC TO ADHESIVE TAPE    Fish Allergy Diarrhea     Patient is allergic to catfish and porta bella mushrooms     Review of Systems   Psychiatric/Behavioral:  Negative for dysphoric mood, hallucinations, self-injury, sleep disturbance and suicidal ideas. The patient is nervous/anxious.             Objective  Vitals:    09/08/23 0917   BP: 118/82   Pulse: 72   Resp: 16   Temp: 97.2 F (36.2 C)   SpO2: 98%          Physical Exam  Skin:     Comments: Flaking and hyperpigmentation to hairline           Assessment & Plan    1. Anxiety  -     escitalopram (LEXAPRO) 10 MG tablet; Take 1 tablet by mouth daily, Disp-90 tablet, R-3Normal  2. Seborrheic dermatitis  -     ketoconazole (NIZORAL) 2 % shampoo; Shampoo into hair once daily x 2-4 wks, Disp-120 mL, R-10, Normal  3. Encounter for immunization  -     Influenza, FLUCELVAX Trivalent, (age 82 mo+) IM, Preservative Free, 0.74mL        Aspects of this note may have been generated using voice recognition software. Despite editing, there may be some syntax errors.    Return for February for Annual.     I have discussed the diagnosis with the patient and the intended plan as seen in the above orders. The patient has received an after-visit summary and questions were answered concerning future plans.  I have discussed medication side effects and warnings with the patient as well.    Franchot Mimes, APRN - CNP

## 2023-09-08 NOTE — Progress Notes (Signed)
Chief Complaint   Patient presents with    Follow-up Chronic Condition     No data recorded  "Have you been to the ER, urgent care clinic since your last visit?  Hospitalized since your last visit?"    NO    "Have you seen or consulted any other health care providers outside our system since your last visit?"    NO     "Have you had a pap smear?"    YES - Where: repap month ago Agust 27th/ Nurse/CMA to request most recent records if not in the chart    No cervical cancer screening on file         Click Here for Release of Records Request     BP 118/82 (Site: Left Upper Arm, Position: Sitting, Cuff Size: Large Adult)   Pulse 72   Temp 97.2 F (36.2 C) (Temporal)   Resp 16   Ht 1.651 m (5\' 5" )   Wt 106.1 kg (234 lb)   SpO2 98%   BMI 38.94 kg/m

## 2024-01-02 NOTE — Telephone Encounter (Signed)
-----   Message from Chadwick T sent at 01/02/2024 10:39 AM EST -----  Regarding: [facility] Appointment Request  [facility] Appointment Request    Patient needs appointment for [facility] Appointment Type: Physical/ Patient requesting to rescheudle the appointment on 01/05/2024 at 2:30 under Dr. Delena Serve for her physical since she has a lot going on.     Patient Requested Dates(s):Anyday, as long as Friday   Patient Requested Time:If possible morning   Provider Name:Wharton, Irving Burton, APRN - CNP    Reason for Appointment Request: Established Patient - No appointments available during search  --------------------------------------------------------------------------------------------------------------------------    Relationship to Patient: Self     Call Back Information: OK to leave message on voicemail  Preferred Call Back Number: Phone [phone]

## 2024-01-05 ENCOUNTER — Encounter: Payer: BLUE CROSS/BLUE SHIELD | Primary: Family

## 2024-01-09 NOTE — Telephone Encounter (Signed)
 Patient spoke with the [facility] and got rescheduled.

## 2024-02-09 ENCOUNTER — Encounter

## 2024-02-09 MED ORDER — KETOCONAZOLE 2 % EX SHAM
2 | CUTANEOUS | 10 refills | 30.00000 days | Status: DC
Start: 2024-02-09 — End: 2024-09-20

## 2024-06-14 ENCOUNTER — Inpatient Hospital Stay
Admit: 2024-06-14 | Discharge: 2024-06-14 | Disposition: A | Discharge status: Home / Self Care | Payer: BLUE CROSS/BLUE SHIELD | Arrived: VH | Attending: Emergency Medicine

## 2024-06-14 ENCOUNTER — Emergency Department: Admit: 2024-06-14 | Payer: BLUE CROSS/BLUE SHIELD

## 2024-06-14 LAB — CBC WITH AUTO DIFFERENTIAL
Basophils %: 0.4 % (ref 0.0–1.0)
Basophils Absolute: 0.03 K/UL (ref 0.00–0.10)
Eosinophils %: 0.4 % (ref 0.0–7.0)
Eosinophils Absolute: 0.03 K/UL (ref 0.00–0.40)
Hematocrit: 41.3 % (ref 35.0–47.0)
Hemoglobin: 14 g/dL (ref 11.5–16.0)
Immature Granulocytes %: 0.2 % (ref 0–0.5)
Immature Granulocytes Absolute: 0.02 K/UL (ref 0.00–0.04)
Lymphocytes %: 29 % (ref 12.0–49.0)
Lymphocytes Absolute: 2.36 K/UL (ref 0.80–3.50)
MCH: 29 pg (ref 26.0–34.0)
MCHC: 33.9 g/dL (ref 30.0–36.5)
MCV: 85.7 FL (ref 80.0–99.0)
MPV: 10.7 FL (ref 8.9–12.9)
Monocytes %: 5 % (ref 5.0–13.0)
Monocytes Absolute: 0.41 K/UL (ref 0.00–1.00)
Neutrophils %: 65 % (ref 32.0–75.0)
Neutrophils Absolute: 5.28 K/UL (ref 1.80–8.00)
Nucleated RBCs: 0 /100{WBCs}
Platelets: 316 K/uL (ref 150–400)
RBC: 4.82 M/uL (ref 3.80–5.20)
RDW: 13.1 % (ref 11.5–14.5)
WBC: 8.1 K/uL (ref 3.6–11.0)
nRBC: 0 K/uL (ref 0.00–0.01)

## 2024-06-14 LAB — COMPREHENSIVE METABOLIC PANEL
ALT: 13 U/L (ref 10–35)
AST: 20 U/L (ref 10–35)
Albumin/Globulin Ratio: 1.3 (ref 1.1–2.2)
Albumin: 4.2 g/dL (ref 3.5–5.2)
Alk Phosphatase: 58 U/L (ref 35–104)
Anion Gap: 11 mmol/L (ref 2–12)
BUN/Creatinine Ratio: 12 (ref 12–20)
BUN: 9 mg/dL (ref 6–20)
CO2: 22 mmol/L (ref 22–29)
Calcium: 9.2 mg/dL (ref 8.6–10.0)
Chloride: 106 mmol/L (ref 98–107)
Creatinine: 0.76 mg/dL (ref 0.50–0.90)
Est, Glom Filt Rate: >90 ml/min/1.73m2 (ref >60)
Globulin: 3.3 g/dL (ref 2.0–4.0)
Glucose: 119 mg/dL — ABNORMAL HIGH (ref 65–100)
Potassium: 4.6 mmol/L (ref 3.5–5.1)
Sodium: 139 mmol/L (ref 136–145)
Total Bilirubin: 0.3 mg/dL (ref 0.2–1.0)
Total Protein: 7.5 g/dL (ref 6.4–8.3)

## 2024-06-14 LAB — TROPONIN T: Troponin T: <6 ng/L (ref 0–14)

## 2024-06-14 MED ORDER — ONDANSETRON HCL 4 MG/2ML IJ SOLN
4 | Freq: Once | INTRAMUSCULAR | Status: AC
Start: 2024-06-14 — End: 2024-06-14
  Administered 2024-06-14: 09:00:00 4 mg via INTRAVENOUS

## 2024-06-14 MED ORDER — SODIUM CHLORIDE 0.9 % IV BOLUS
0.9 | Freq: Once | INTRAVENOUS | Status: AC
Start: 2024-06-14 — End: 2024-06-14
  Administered 2024-06-14: 09:00:00 1000 mL via INTRAVENOUS

## 2024-06-14 MED ORDER — ALUM & MAG HYDROXIDE-SIMETH 200-200-20 MG/5ML PO SUSP
200-200-20 | Freq: Once | ORAL | Status: AC
Start: 2024-06-14 — End: 2024-06-14
  Administered 2024-06-14: 09:00:00 40 mL via ORAL

## 2024-06-14 MED ORDER — KETOROLAC TROMETHAMINE 30 MG/ML IJ SOLN
30 | Freq: Once | INTRAMUSCULAR | Status: AC
Start: 2024-06-14 — End: 2024-06-14
  Administered 2024-06-14: 09:00:00 15 mg via INTRAVENOUS

## 2024-06-14 MED ORDER — ONDANSETRON HCL 4 MG PO TABS
4 | ORAL_TABLET | Freq: Three times a day (TID) | ORAL | 0 refills | 7.00000 days | Status: DC | PRN
Start: 2024-06-14 — End: 2024-09-20

## 2024-06-14 MED ORDER — KETOROLAC TROMETHAMINE 10 MG PO TABS
10 | ORAL_TABLET | Freq: Four times a day (QID) | ORAL | 0 refills | 5.00000 days | Status: DC | PRN
Start: 2024-06-14 — End: 2024-09-20

## 2024-06-14 MED FILL — MAG-AL PLUS 200-200-20 MG/5ML PO LIQD: 200-200-20 MG/5ML | ORAL | Qty: 30 | Fill #0

## 2024-06-14 MED FILL — SODIUM CHLORIDE 0.9 % IV SOLN: 0.9 % | INTRAVENOUS | Qty: 1000 | Fill #0

## 2024-06-14 MED FILL — KETOROLAC TROMETHAMINE 30 MG/ML IJ SOLN: 30 mg/mL | INTRAMUSCULAR | Qty: 1 | Fill #0

## 2024-06-14 MED FILL — ONDANSETRON HCL 4 MG/2ML IJ SOLN: 4 MG/2ML | INTRAMUSCULAR | Qty: 2 | Fill #0

## 2024-06-14 NOTE — ED Notes (Signed)
 Pt reports given to oncoming RN. Opportunity for questions provided and clarified as needed. Pt is a/ox4. VSS. No new distress noted.

## 2024-06-14 NOTE — ED Provider Notes (Signed)
 [facility] EMERGENCY DEPARTMENT  EMERGENCY DEPARTMENT ENCOUNTER      Pt Name: Leah Huff  MRN: [id number]  Birthdate 1986-03-04  Date of evaluation: 06/14/2024  Provider: Corean Sheffield, DO    CHIEF COMPLAINT       Chief Complaint   Patient presents with    Abdominal pain         HISTORY OF PRESENT ILLNESS    HPI    Leah Huff is a 38 y.o. female with a history of anxiety who presents to the emergency department for evaluation of abdominal and back pain.  Patient reports this evening around 10 PM she developed epigastric discomfort radiating around to her mid back.  She has had associated nausea and vomiting with 1 episode of emesis.  States around 7 she ate some chicken which had some greasy sauce.  Also notes eating ice cream.  States she trialed Tylenol without relief of symptoms.  No anterior chest pain.  No shortness of breath.  No other recent illness.    Nursing Notes were reviewed.    REVIEW OF SYSTEMS       Review of Systems   Constitutional:  Negative for fever.   Respiratory:  Negative for shortness of breath.    Gastrointestinal:  Positive for abdominal pain, nausea and vomiting. Negative for diarrhea.           PAST MEDICAL HISTORY     Past Medical History:   Diagnosis Date    Abnormal vaginal cytology     Anxiety     Presence of Mirena IUD     Placed six years ago         SURGICAL HISTORY       Past Surgical History:   Procedure Laterality Date    CESAREAN SECTION           CURRENT MEDICATIONS       Discharge Medication List as of 06/14/2024  7:03 AM        CONTINUE these medications which have NOT CHANGED    Details   ketoconazole  (NIZORAL ) 2 % shampoo SHAMPOO INTO HAIR ONCE DAILY X 2-4 WKS, Disp-120 mL, R-10, Normal      WEGOVY 2.4 MG/0.75ML SOAJ SC injection INJECT 2.4 MG SUBCUTANEOUSLY ONCE WEEKLY ON THE SAME DAY EACH WEEK, DAWHistorical Med      metFORMIN (GLUCOPHAGE-XR) 500 MG extended release tablet Historical Med      escitalopram  (LEXAPRO ) 10 MG tablet Take 1 tablet by  mouth daily, Disp-90 tablet, R-3Normal      PROBIOTIC PRODUCT PO Take 1 tablet by mouth dailyHistorical Med      loratadine (CLARITIN) 10 MG tablet Take 1 tablet by mouth dailyHistorical Med             ALLERGIES     Adhesive tape, Silicone, and Fish allergy    FAMILY HISTORY       Family History   Problem Relation Age of Onset    Thyroid Disease Mother     Hypertension Mother     Cancer Maternal Grandmother           SOCIAL HISTORY       Social History     Socioeconomic History    Marital status: Married     Spouse name: None    Number of children: None    Years of education: None    Highest education level: None   Tobacco Use    Smoking status: Never    Smokeless tobacco: Never  Vaping Use    Vaping status: Never Used   Substance and Sexual Activity    Alcohol use: Yes     Comment: ocassionally    Drug use: Never    Sexual activity: Yes     Partners: Male     Social Drivers of Health     Financial Resource Strain: Low Risk  (12/28/2022)    Overall Financial Resource Strain (CARDIA)     Difficulty of Paying Living Expenses: Not hard at all   Food Insecurity: No Food Insecurity (12/28/2022)    Hunger Vital Sign     Worried About Running Out of Food in the Last Year: Never true     Ran Out of Food in the Last Year: Never true   Transportation Needs: Unknown (12/28/2022)    PRAPARE - Transportation     Lack of Transportation (Non-Medical): No   Physical Activity: Insufficiently Active (06/22/2021)    Received from [facility], [facility]    Exercise Vital Sign     Days of Exercise per Week: 7 days     Minutes of Exercise per Session: 20 min   Stress: No Stress Concern Present (06/22/2021)    Received from [facility], [facility] of [facility] - Occupational Stress Questionnaire     Feeling of Stress : Only a little    [facility]    Social Network    Received from [facility], [facility]    HITS   Housing Stability: Unknown (12/28/2022)    Housing  Stability Vital Sign     Unstable Housing in the Last Year: No           PHYSICAL EXAM       ED Triage Vitals [06/14/24 0446]   BP Systolic BP Percentile Diastolic BP Percentile Temp Temp Source Pulse Respirations SpO2   (!) 151/92 -- -- 98.2 F (36.8 C) Oral 71 20 99 %      Height Weight - Scale         1.651 m (5' 5) 110.7 kg (244 lb)             Body mass index is 40.6 kg/m.    Physical Exam  Vitals and nursing note reviewed.   Constitutional:       General: She is in acute distress.      Appearance: Normal appearance. She is not ill-appearing or toxic-appearing.   HENT:      Head: Normocephalic and atraumatic.   Eyes:      General: No scleral icterus.        Right eye: No discharge.         Left eye: No discharge.      Conjunctiva/sclera: Conjunctivae normal.   Cardiovascular:      Rate and Rhythm: Normal rate.      Pulses: Normal pulses.   Pulmonary:      Effort: Pulmonary effort is normal. No respiratory distress.      Breath sounds: Normal breath sounds.   Abdominal:      Tenderness: There is abdominal tenderness in the right upper quadrant. There is no guarding or rebound.   Musculoskeletal:         General: Normal range of motion.      Cervical back: Normal range of motion.   Skin:     General: Skin is warm and dry.      Capillary Refill: Capillary refill takes less than  2 seconds.   Neurological:      General: No focal deficit present.      Mental Status: She is alert.   Psychiatric:         Mood and Affect: Mood normal.         Behavior: Behavior normal.         DIAGNOSTIC RESULTS     EKG: All EKG's are interpreted by the Emergency Department Physician who either signs or Co-signs this chart in the absence of a cardiologist.    ED Course as of 06/15/24 0732   Fri Jun 14, 2024   0451 EKG 12 Lead  ECG at 4:41 AM, interpreted by me: Sinus rhythm with sinus arrhythmia, rate 68 bpm.  Normal axis.  Narrow QRS.  No ST elevations. [SH]      ED Course User Index  [SH] Dwane Krabbe, DO       RADIOLOGY:    Non-plain film images such as CT, Ultrasound and MRI are read by the radiologist. Plain radiographic images are visualized and preliminarily interpreted by the emergency physician with the below findings:        Interpretation per the Radiologist below, if available at the time of this note:    US  ABDOMEN LIMITED Specify organ? GALLBLADDER   Final Result   Hepatic steatosis. Cholelithiasis with some tenderness but no   gallbladder wall thickening.            Electronically signed by DARICE COLON           LABS:  Labs Reviewed   COMPREHENSIVE METABOLIC PANEL - Abnormal; Notable for the following components:       Result Value    Glucose 119 (*)     All other components within normal limits   CBC WITH AUTO DIFFERENTIAL   TROPONIN T       All other labs were within normal range or not returned as of this dictation.    EMERGENCY DEPARTMENT COURSE and DIFFERENTIAL DIAGNOSIS/MDM:   Vitals:    Vitals:    06/14/24 0500 06/14/24 0515 06/14/24 0530 06/14/24 0545   BP: (!) 142/90 127/71 133/88 134/83   Pulse: 65 68 62 69   Resp: 12 19 16 19    Temp:       TempSrc:       SpO2: 99% 98% 99% 97%   Weight:       Height:               Medical Decision Making      DECISION MAKING:  Derionna Salvador is a 38 y.o. female who comes in as above. Patient afebrile, vital signs notable for blood pressure 151/92, vital signs otherwise stable.  On my examination, well-appearing, nontoxic.  Does have right upper quadrant tenderness, no guarding or rebound.  Differential diagnosis includes, but not limited to, cholelithiasis, cholecystitis, GERD, peptic ulcer disease, ACS, pancreatitis.    Labs reviewed.  CBC without leukocytosis or anemia.  Electrolytes, kidney function and LFTs normal.  Troponin T within normal limits.  UA does show cholelithiasis with no gallbladder wall thickening.      Amount and/or Complexity of Data Reviewed  Labs: ordered. Decision-making details documented in ED Course.  Radiology: ordered. Decision-making details  documented in ED Course.  ECG/medicine tests: ordered and independent interpretation performed. Decision-making details documented in ED Course.    Risk  Prescription drug management.        CONSULTS:  None    PROCEDURES:  Unless  otherwise noted below, none     Procedures    REASSESSMENT       On reevaluation feeling improvement after medications.  All available radiology and laboratory results have been reviewed with patient and/or available family.  Discussed with patient regarding follow-up with general surgery regarding cholelithiasis.  Strict ER return precautions given including persistent pain despite use of Toradol , vomiting and cannot keep fluids down, fever development.  Patient and/or family verbally conveyed their understanding and agreement of the patient's signs, symptoms, diagnosis, treatment and prognosis and additionally agree to follow-up as recommended in the discharge instructions or to return to the Emergency Department should their condition change or worsen prior to their follow-up appointment.  All questions have been answered and patient and/or available family express understanding.          FINAL IMPRESSION      1. Calculus of gallbladder without cholecystitis without obstruction          DISPOSITION/PLAN   DISPOSITION Decision To Discharge 06/14/2024 07:01:30 AM      PATIENT REFERRED TO:  Claudene Jerel RAMAN, APRN - NP    Schedule an appointment as soon as possible for a visit       [place]  [address]  [place] [address]  [phone]    Schedule an appointment as soon as possible for a visit       [facility] Emergency Department  [address]  [place]  [address]  [phone]    If symptoms worsen      DISCHARGE MEDICATIONS:  Discharge Medication List as of 06/14/2024  7:03 AM        START taking these medications    Details   ketorolac  (TORADOL ) 10 MG tablet Take 1 tablet by mouth every 6 hours as needed for Pain Do not take for more than 4  days, Disp-16 tablet, R-0Normal      ondansetron  (ZOFRAN ) 4 MG tablet Take 1 tablet by mouth 3 times daily as needed for Nausea or Vomiting, Disp-15 tablet, R-0Normal               (Please note that portions of this note were completed with a voice recognition program.  Efforts were made to edit the dictations but occasionally words are mis-transcribed.)    Corean Sheffield, DO (electronically signed)  Emergency Medicine Attending Physician          Sheffield Corean, DO  06/15/24 [phone]

## 2024-06-14 NOTE — Discharge Instructions (Addendum)
 If pain returns take Toradol  as needed. Keep a low fat diet. Follow up with general surgery.

## 2024-06-14 NOTE — ED Triage Notes (Signed)
 Pt arrives today via pv with reports of epigastric pressure radiating to mid back. Pt endorses nausea and vomiting. One episode of vomiting. Pt reports that she ate chicken, with sauce and ice cream at approx 1900. Pt reports the pain started at approx 2200. Pt reports hx of acid reflux with pregnancy. Pt attempted to self treat at home with tylenol 500mg , pain unrelieved. Pt is ambulatory in triage but appears uncomfortable. VSS.

## 2024-06-17 LAB — EKG 12-LEAD
Atrial Rate: 66 {beats}/min
Diagnosis: NORMAL
P Axis: 69 degrees
P-R Interval: 152 ms
Q-T Interval: 406 ms
QRS Duration: 78 ms
QTc Calculation (Bazett): 425 ms
R Axis: -7 degrees
T Axis: -11 degrees
Ventricular Rate: 66 {beats}/min

## 2024-06-27 ENCOUNTER — Ambulatory Visit: Admit: 2024-06-27 | Discharge: 2024-06-27 | Payer: BLUE CROSS/BLUE SHIELD | Attending: Surgery

## 2024-06-27 VITALS — BP 117/81 | HR 56 | Temp 98.00000°F | Resp 16 | Ht 65.0 in | Wt 241.2 lb

## 2024-06-27 DIAGNOSIS — K8050 Calculus of bile duct without cholangitis or cholecystitis without obstruction: Principal | ICD-10-CM

## 2024-06-27 NOTE — Progress Notes (Signed)
 Identified pt with two pt identifiers (name and DOB). Reviewed chart in preparation for visit and have obtained necessary documentation.    Leah Huff is a 38 y.o. female  Chief Complaint   Patient presents with    Follow-up    ED Follow-up     Calculus of gallbladder      BP 117/81 (BP Site: Left Upper Arm, Patient Position: Sitting, BP Cuff Size: Large Adult)   Pulse 56   Temp 98 F (36.7 C) (Oral)   Resp 16   Ht 1.651 m (5' 5)   Wt 109.4 kg (241 lb 3.2 oz)   SpO2 94%   BMI 40.14 kg/m     1. Have you been to the ER, urgent care clinic since your last visit?  Hospitalized since your last visit?new pt    2. Have you seen or consulted any other health care providers outside of the [facility] since your last visit?  Include any pap smears or colon screening. New pt

## 2024-06-27 NOTE — Progress Notes (Signed)
 Surgery Progress Note    06/27/2024    had concerns including Follow-up and ED Follow-up (Calculus of gallbladder ).     Subjective:     Patient is a 38 y.o. female who reports that she has been having for several months some upper mid back pain associated with nausea, episodes of vomiting and some chills.  She denies any bloating, diarrhea, fevers.  One of the episodes did last a very long time and it ended up taking her to the ED where she had an ultrasound which showed gallstones.  Her labs which I have evaluated from that ED workup on 06/14/2024 were essentially normal.  She reports she has tried to change her diet to eating less fatty meals and less creamy meals and it has improved her symptoms.    Past surgical history of C-section    I have evaluated the ED notes and labs personally      Constitutional: No fever or chills  Neurologic: No headache  Eyes: No scleral icterus or irritated eyes  Nose: No nasal pain or drainage  Mouth: No oral lesions or sore throat  Cardiac: No palpations or chest pain  Pulmonary: No cough or shortness of breath  Gastrointestinal: No nausea, emesis, diarrhea, or constipation  Genitourinary: No dysuria  Musculoskeletal: No muscle or joint tenderness  Skin: No rashes or lesions  Psychiatric: No anxiety or depressed mood    Current Outpatient Medications   Medication Instructions    Cyanocobalamin (B-12 PO) Take by mouth    escitalopram  (LEXAPRO ) 10 mg, Oral, DAILY    ketoconazole  (NIZORAL ) 2 % shampoo SHAMPOO INTO HAIR ONCE DAILY X 2-4 WKS    ketorolac  (TORADOL ) 10 mg, Oral, EVERY 6 HOURS PRN, Do not take for more than 4 days    loratadine (CLARITIN) 10 mg, DAILY    Magnesium Oxide (NATRUL MAGNESIUM PO) Take by mouth    metFORMIN (GLUCOPHAGE-XR) 500 MG extended release tablet     ondansetron  (ZOFRAN ) 4 mg, Oral, 3 TIMES DAILY PRN    PROBIOTIC PRODUCT PO 1 tablet, DAILY    WEGOVY 2.4 MG/0.75ML SOAJ SC injection INJECT 2.4 MG SUBCUTANEOUSLY ONCE WEEKLY ON THE SAME DAY EACH WEEK        Allergies   Allergen Reactions    Adhesive Tape Hives and Itching     PT IS ALLERGIC TO ADHESIVE TAPE    Silicone Hives and Itching     PT IS ALLERGIC TO ADHESIVE TAPE    Fish Allergy Diarrhea     Patient is allergic to catfish and [place] mushrooms        Past Medical History:   Diagnosis Date    Abnormal vaginal cytology     Anxiety     Presence of Mirena IUD     Placed six years ago        Past Surgical History:   Procedure Laterality Date    CESAREAN SECTION          Social History     Socioeconomic History    Marital status: Married     Spouse name: None    Number of children: None    Years of education: None    Highest education level: None   Tobacco Use    Smoking status: Never    Smokeless tobacco: Never   Vaping Use    Vaping status: Never Used   Substance and Sexual Activity    Alcohol use: Yes     Comment: ocassionally  Drug use: Never    Sexual activity: Yes     Partners: Male     Social Drivers of Health     Financial Resource Strain: Low Risk  (12/28/2022)    Overall Financial Resource Strain (CARDIA)     Difficulty of Paying Living Expenses: Not hard at all   Food Insecurity: No Food Insecurity (12/28/2022)    Hunger Vital Sign     Worried About Running Out of Food in the Last Year: Never true     Ran Out of Food in the Last Year: Never true   Transportation Needs: Unknown (12/28/2022)    PRAPARE - Transportation     Lack of Transportation (Non-Medical): No   Physical Activity: Insufficiently Active (06/22/2021)    Received from [facility], [facility]    Exercise Vital Sign     Days of Exercise per Week: 7 days     Minutes of Exercise per Session: 20 min   Stress: No Stress Concern Present (06/22/2021)    Received from [facility], [facility] of [facility] - Occupational Stress Questionnaire     Feeling of Stress : Only a little    Received from [employer], [facility]    Social Network    Received from [facility], [facility]    HITS   Housing  Stability: Unknown (12/28/2022)    Housing Stability Vital Sign     Unstable Housing in the Last Year: No        Family History   Problem Relation Age of Onset    Thyroid Disease Mother     Hypertension Mother     Cancer Maternal Grandmother         Objective:     Vitals:    06/27/24 1230   BP: 117/81   Pulse: 56   Resp: 16   Temp: 98 F (36.7 C)   SpO2: 94%     Body mass index is 40.14 kg/m.  Wt Readings from Last 3 Encounters:   06/27/24 109.4 kg (241 lb 3.2 oz)   06/14/24 110.7 kg (244 lb)   09/08/23 106.1 kg (234 lb)        General: No acute distress, conversant  Eyes: PERRLA, no scleral icterus  HENT: Normocephalic, PEERLA  Neck: Trachea midline without LAD  Cardiac: Normal pulse rate and rhythm  Pulmonary: Symmetric chest rise with normal effort, no respiratory distress  GI: Soft, slight discomfort in epigastric right upper quadrant area but no pain on palpation  Skin: Warm without rash  Extremities: No edema or joint stiffness  Psych: Appropriate mood and affect      Assessment / Plan:     1. Biliary colic       38 y.o. female who appears to have be having some episodes of biliary colic.  She does have an ultrasound that shows gallstones.  I discussed operative and nonoperative management with the patient.  Explained how operative management would be  Explained to patient gallbladder anatomy.  Recommend robotic assisted laparoscopic cholecystectomy with indocyanine green.  Explained the procedure along with risks and benefits.  Risks explained are, but not limited to, infection, bleeding, injury to nearby structures (ducts, intestine, etc.), bile leaks, hernias.  Explained that about 10% of patients develop self limiting diarrhea.  Explained post-operative care to be no heavy lifting (>10-15lbs) for 4 weeks from the date of surgery.  Low fat diet for 2 weeks then, no dietary restrictions.  May  shower 24 hours after surgery. No baths or pools for 2 weeks  At this time she would like to continue with  nonoperative management with a low-fat diet  Patient to follow back up if she would like to schedule surgery    Elijah FORBES [employer], MD   [facility]

## 2024-08-08 ENCOUNTER — Encounter: Payer: BLUE CROSS/BLUE SHIELD | Attending: Surgery

## 2024-08-08 NOTE — Addendum Note (Signed)
 Addended by: Kwame Ryland E. on: 08/08/2024 09:18 AM     Modules accepted: Orders

## 2024-08-08 NOTE — Telephone Encounter (Signed)
 Returned call to patient and verified using two patient identifiers.  Contacted patient and discussed the pain she has been experiencing in her RUQ.  She states that she has not been feeling well all week and over the last couple of days she has been having 5/10 pain and she has tried the Toradol  that she was prescribed from the ER>  She did take ibuprofen 600 mg once and it did seem to help a little and then took Toradol  later that evening.  Advised to take tylenol 1000 mg and alternate with ibuprofen 800 mg every three hours.  Also recommended applying warm compress to the area and eating a low fat diet.  Patient would like to go ahead and schedule her cholecystectomy.  Advised Dr. Chrystal of above and she will send posting to her scheduler.  Pt understood and was thankful for the call.

## 2024-08-09 ENCOUNTER — Encounter

## 2024-08-09 NOTE — Telephone Encounter (Signed)
 Returned call to patient and verified using two patient identifiers. Patient inquired how long her recovery will be after surgery.  Advised patient typically patients are out of work for 2 weeks and have lifting restriction of >10-15lbs for 4 weeks post-op.  Advised she should follow a low fat diet for a couple of weeks post-op and should have tylenol 1000 mg and ibuprofen 800 mg on hand.  Pt understood and was thankful for the call.

## 2024-08-09 NOTE — Telephone Encounter (Signed)
 PT returned call, confirmed name and DOB. Discussed surgery dates with Dr. Chrystal, offered 9/29 and PT accepted. Notified PT of arrival time and PT confirmed address. Surgical letter will be sent in the mail. PT also inquired about some post op questions, I let PT know I would send the clinical team a message. PT understood.

## 2024-08-09 NOTE — Telephone Encounter (Signed)
 Called and spoke to PT, let her know the previous date of 9/29 I provided for surgery with Dr. Chrystal the OR was unable to accommodate. I offered 9/30 and PT accepted. Notified PT of new arrival time, surgical letter will be sent in the mail.

## 2024-08-09 NOTE — Telephone Encounter (Signed)
 Called and LVM for PT to call back and schedule surgery with Dr. Lauretta Grill.

## 2024-08-12 NOTE — Other (Signed)
 [facility]                   [address] [place], [place] [address]   MAIN OR                                  [phone]   MAIN PRE OP                          [phone]                                                                                AMBULATORY PRE OP          [phone]  PRE-ADMISSION TESTING    [phone]     Surgery Date:  Tuesday 08/20/24      INSTRUCTIONS BEFORE YOUR SURGERY   Arrival Time [facility] Pre-op staff will call you between 1:30 and 6pm the day before your surgery with your arrival time. If your surgery is on a Monday, they will call you the Friday before. If it's after 6pm the day prior to surgery and you have not received a time yet, please call [phone].   Where to Check In   Come through the [facility] entrance. Take the elevators on the left side of the lobby to the 2nd floor. The admitting desk will be on your right.     Please bring the following items to register:  photo ID, insurance card, co-pay if needed, medical directive, DNR, and/or POA if applicable.     Free valet parking is available 7am to 5pm.   Food Drink Alcohol Marijuana    No food or drink (gum, mints, coffee, juice, etc) after midnight the night before surgery.  EXCEPTION:  You may drink WATER ONLY up until 2 hours prior to your surgery time. Please do not add anything to your water as this may result in your surgery being postponed.       No alcohol (beer, wine, liquor) or marijuana 24 hours before or after surgery.       If you are a diabetic, glucose tablets may be taken with water for low blood sugar if needed.   PRE-OP Medication Instructions      MEDICATIONS TO TAKE THE MORNING OF SURGERY WITH WATER:   Zofran  if needed  Tylenol                       Medications to STOP 8 Days Before Surgery Non-Steroidal anti-inflammatory Drugs (NSAID's): for example: Ibuprofen, Advil, Motrin, Naproxen, Aleve  Aspirin and Aspirin containing products (BC Powder, Excedrin,  etc.)  Weight loss and/or diabetic GLP1 medications (Wegovy, Ozempic, Semaglutide, Trulicity, Mounjaro, Zepbound, Tirzepatide, etc)  Herbal supplements, vitamins, and fish oil  Other:  tylenol may be taken   Blood Thinners None    Pre-op Hygiene     Shower daily with antibacterial soap starting 3 days prior to surgery.  The morning of surgery:  Please shower with Dial antimicrobial soap and dry  off with a clean towel  After you shower, please do not apply anything to your skin such as lotion, powder, cologne, deodorant, or makeup  Do not shave or trim anywhere 24 hours before surgery  If you have long hair, please wear it loose or down, without pony-tails, braids, or metal hair accessories  Wear loose, comfortable, clean clothes  Remove dentures and all body piercings and jewelry, including wedding bands  Wear glasses instead of contacts (if applicable)  Leave money, valuables, jewelry at home   Going Home or Spending the Night SAME-DAY SURGERY: You must have a responsible adult drive you home and stay with you 24 hours after surgery    ADMITS: If your doctor is keeping you in the hospital after surgery, leave personal belongings/luggage in your car until you have a hospital room number     Special Instructions Please bring the following items to Pre-op:   Updated medication list  Case for glasses/contacts/dentures/hearing aids  CPAP machine or rescue inhaler (if applicable)                   If a situation occurs and you are delayed the day of surgery, call [phone]     If your physical condition changes (like a fever, cold, flu, etc.) call your surgeon for further instructions.     Home medication(s) reviewed and verified verbally with list during PAT phone call.    The patient was contacted via phone call.    The patient verbalizes understanding of all instructions and does not need reinforcement.

## 2024-08-19 NOTE — Patient Instructions (Signed)
 Hello,     You are scheduled to have surgery tomorrow at [facility]. [facility].     We would like for you to arrive at  0530 am  We are located on the second floor. You will check-in at the registration desk located outside the elevators on the second floor.  Please be prepared to show registration your photo ID and insurance card.  Remember nothing to eat or drink after midnight. If you need to take medications the morning of surgery, please take with a few sips of water .   Wear loose, comfortable clothing and leave all your jewelry at home.   You may bring your cell phone with you.  One family member will be allowed in the pre-op area once you are dressed and your IV has been started.   You will need someone to drive you home and be with you for 24 hours post-anesthesia.     We look forward to seeing you! Call [phone] for questions after hours and [phone] between 5:30AM and 6PM.     Thanks!    [facility] PREOP TEAM

## 2024-08-20 ENCOUNTER — Inpatient Hospital Stay: Payer: BLUE CROSS/BLUE SHIELD

## 2024-08-20 LAB — POCT GLUCOSE: POC Glucose: 101 mg/dL (ref 65–117)

## 2024-08-20 LAB — POC PREGNANCY UR-QUAL: Preg Test, Ur: NEGATIVE

## 2024-08-20 MED ORDER — LIDOCAINE HCL (PF) 1 % IJ SOLN
1 | Freq: Once | INTRAMUSCULAR | Status: DC | PRN
Start: 2024-08-20 — End: 2024-08-20

## 2024-08-20 MED ORDER — ROCURONIUM BROMIDE 50 MG/5ML IV SOLN
50 | Freq: Once | INTRAVENOUS | Status: DC | PRN
Start: 2024-08-20 — End: 2024-08-20
  Administered 2024-08-20: 12:00:00 50 via INTRAVENOUS
  Administered 2024-08-20: 12:00:00 10 via INTRAVENOUS

## 2024-08-20 MED ORDER — NORMAL SALINE FLUSH 0.9 % IV SOLN
0.9 | Freq: Two times a day (BID) | INTRAVENOUS | Status: DC
Start: 2024-08-20 — End: 2024-08-20

## 2024-08-20 MED ORDER — SODIUM CHLORIDE 0.9 % IV SOLN
0.9 | INTRAVENOUS | Status: DC | PRN
Start: 2024-08-20 — End: 2024-08-20

## 2024-08-20 MED ORDER — ROCURONIUM BROMIDE 50 MG/5ML IV SOLN
50 | INTRAVENOUS | Status: AC
Start: 2024-08-20 — End: 2024-08-20

## 2024-08-20 MED ORDER — HYDROMORPHONE 0.5MG/0.5ML IJ SOLN
1 | Status: DC | PRN
Start: 2024-08-20 — End: 2024-08-20

## 2024-08-20 MED ORDER — MIDAZOLAM HCL (PF) 2 MG/2ML IJ SOLN
2 | Freq: Once | INTRAMUSCULAR | Status: DC | PRN
Start: 2024-08-20 — End: 2024-08-20

## 2024-08-20 MED ORDER — LACTATED RINGERS IV SOLN
INTRAVENOUS | Status: DC
Start: 2024-08-20 — End: 2024-08-20

## 2024-08-20 MED ORDER — DIPHENHYDRAMINE HCL 50 MG/ML IJ SOLN
50 | Freq: Once | INTRAMUSCULAR | Status: DC | PRN
Start: 2024-08-20 — End: 2024-08-20

## 2024-08-20 MED ORDER — ONDANSETRON HCL 4 MG/2ML IJ SOLN
4 | Freq: Once | INTRAMUSCULAR | Status: DC | PRN
Start: 2024-08-20 — End: 2024-08-20
  Administered 2024-08-20: 12:00:00 4 via INTRAVENOUS

## 2024-08-20 MED ORDER — OXYCODONE HCL 5 MG PO TABS
5 | ORAL_TABLET | Freq: Four times a day (QID) | ORAL | 0 refills | 5.00000 days | Status: AC | PRN
Start: 2024-08-20 — End: 2024-08-23
  Filled 2024-08-20: qty 12, 3d supply, fill #0

## 2024-08-20 MED ORDER — PROPOFOL 200 MG/20ML IV EMUL
200 | INTRAVENOUS | Status: AC
Start: 2024-08-20 — End: 2024-08-20

## 2024-08-20 MED ORDER — DEXAMETHASONE SODIUM PHOSPHATE 4 MG/ML IJ SOLN
4 | INTRAMUSCULAR | Status: AC
Start: 2024-08-20 — End: 2024-08-20

## 2024-08-20 MED ORDER — HYDROMORPHONE 0.5MG/0.5ML IJ SOLN
1 | Status: DC | PRN
Start: 2024-08-20 — End: 2024-08-20
  Administered 2024-08-20: 13:00:00 0.5 mg via INTRAVENOUS

## 2024-08-20 MED ORDER — LIDOCAINE HCL (PF) 2 % IJ SOLN
2 | INTRAMUSCULAR | Status: AC
Start: 2024-08-20 — End: 2024-08-20

## 2024-08-20 MED ORDER — SUGAMMADEX SODIUM 200 MG/2ML IV SOLN
200 | Freq: Once | INTRAVENOUS | Status: DC | PRN
Start: 2024-08-20 — End: 2024-08-20
  Administered 2024-08-20: 13:00:00 200 via INTRAVENOUS

## 2024-08-20 MED ORDER — NORMAL SALINE FLUSH 0.9 % IV SOLN
0.9 | INTRAVENOUS | Status: DC | PRN
Start: 2024-08-20 — End: 2024-08-20

## 2024-08-20 MED ORDER — LACTATED RINGERS IV SOLN
INTRAVENOUS | Status: DC | PRN
Start: 2024-08-20 — End: 2024-08-20
  Administered 2024-08-20: 12:00:00 via INTRAVENOUS

## 2024-08-20 MED ORDER — FENTANYL CITRATE (PF) 250 MCG/5ML IJ SOLN
250 | INTRAMUSCULAR | Status: AC
Start: 2024-08-20 — End: 2024-08-20

## 2024-08-20 MED ORDER — PROPOFOL 200 MG/20ML IV EMUL
200 | Freq: Once | INTRAVENOUS | Status: DC | PRN
Start: 2024-08-20 — End: 2024-08-20
  Administered 2024-08-20: 12:00:00 200 via INTRAVENOUS

## 2024-08-20 MED ORDER — ONDANSETRON HCL 4 MG/2ML IJ SOLN
4 | INTRAMUSCULAR | Status: AC
Start: 2024-08-20 — End: 2024-08-20

## 2024-08-20 MED ORDER — STERILE WATER FOR INJECTION (MIXTURES ONLY)
1 | INTRAMUSCULAR | Status: AC
Start: 2024-08-20 — End: 2024-08-20
  Administered 2024-08-20: 12:00:00 2000 mg via INTRAVENOUS

## 2024-08-20 MED ORDER — PHENYLEPHRINE HCL (PRESSORS) 10 MG/ML IV SOLN
10 | INTRAVENOUS | Status: AC
Start: 2024-08-20 — End: 2024-08-20

## 2024-08-20 MED ORDER — BUPIVACAINE HCL 0.5 % IJ SOLN
0.5 | INTRAMUSCULAR | Status: DC | PRN
Start: 2024-08-20 — End: 2024-08-20
  Administered 2024-08-20: 12:00:00 30 via SUBCUTANEOUS

## 2024-08-20 MED ORDER — NALOXONE HCL 0.4 MG/ML IJ SOLN
0.4 | INTRAMUSCULAR | Status: DC | PRN
Start: 2024-08-20 — End: 2024-08-20

## 2024-08-20 MED ORDER — INDOCYANINE GREEN 25 MG IJ SOLR
25 | Freq: Once | INTRAMUSCULAR | Status: DC
Start: 2024-08-20 — End: 2024-08-20

## 2024-08-20 MED ORDER — SODIUM CHLORIDE (PF) 0.9 % IJ SOLN
0.9 | INTRAMUSCULAR | Status: DC | PRN
Start: 2024-08-20 — End: 2024-08-20

## 2024-08-20 MED ORDER — FENTANYL CITRATE (PF) 100 MCG/2ML IJ SOLN
100 | Freq: Once | INTRAMUSCULAR | Status: DC | PRN
Start: 2024-08-20 — End: 2024-08-20

## 2024-08-20 MED ORDER — MIDAZOLAM HCL 2 MG/2ML IJ SOLN
2 | Freq: Once | INTRAMUSCULAR | Status: DC | PRN
Start: 2024-08-20 — End: 2024-08-20
  Administered 2024-08-20: 12:00:00 2 via INTRAVENOUS

## 2024-08-20 MED ORDER — BUPIVACAINE HCL (PF) 0.5 % IJ SOLN
0.5 | INTRAMUSCULAR | Status: AC
Start: 2024-08-20 — End: 2024-08-20

## 2024-08-20 MED ORDER — EPHEDRINE SULFATE (PRESSORS) 25 MG/5ML IV SOSY
25 | Freq: Once | INTRAVENOUS | Status: DC | PRN
Start: 2024-08-20 — End: 2024-08-20
  Administered 2024-08-20: 12:00:00 10 via INTRAVENOUS

## 2024-08-20 MED ORDER — MIDAZOLAM HCL 2 MG/2ML IJ SOLN
2 | INTRAMUSCULAR | Status: AC
Start: 2024-08-20 — End: 2024-08-20

## 2024-08-20 MED ORDER — FENTANYL CITRATE (PF) 250 MCG/5ML IJ SOLN
250 | Freq: Once | INTRAMUSCULAR | Status: DC | PRN
Start: 2024-08-20 — End: 2024-08-20
  Administered 2024-08-20 (×5): 50 via INTRAVENOUS

## 2024-08-20 MED ORDER — DEXAMETHASONE SODIUM PHOSPHATE 4 MG/ML IJ SOLN
4 | Freq: Once | INTRAMUSCULAR | Status: DC | PRN
Start: 2024-08-20 — End: 2024-08-20
  Administered 2024-08-20: 12:00:00 8 via INTRAVENOUS

## 2024-08-20 MED ORDER — LIDOCAINE HCL (PF) 2 % IJ SOLN
2 | Freq: Once | INTRAMUSCULAR | Status: DC | PRN
Start: 2024-08-20 — End: 2024-08-20
  Administered 2024-08-20: 12:00:00 100 via INTRAVENOUS

## 2024-08-20 MED ORDER — SUGAMMADEX SODIUM 200 MG/2ML IV SOLN
200 | INTRAVENOUS | Status: AC
Start: 2024-08-20 — End: 2024-08-20

## 2024-08-20 MED ORDER — ONDANSETRON HCL 4 MG/2ML IJ SOLN
4 | Freq: Once | INTRAMUSCULAR | Status: DC | PRN
Start: 2024-08-20 — End: 2024-08-20

## 2024-08-20 MED ORDER — EPHEDRINE SULFATE (PRESSORS) 25 MG/5ML IV SOSY
25 | INTRAVENOUS | Status: AC
Start: 2024-08-20 — End: 2024-08-20

## 2024-08-20 MED FILL — LACTATED RINGERS IV SOLN: INTRAVENOUS | Qty: 1000 | Fill #0

## 2024-08-20 MED FILL — ROCURONIUM BROMIDE 50 MG/5ML IV SOLN: 50 MG/5ML | INTRAVENOUS | Qty: 5 | Fill #0

## 2024-08-20 MED FILL — HYDROMORPHONE HCL 1 MG/ML IJ SOLN: 1 mg/mL | INTRAMUSCULAR | Qty: 0.5 | Fill #0

## 2024-08-20 MED FILL — DEXAMETHASONE SODIUM PHOSPHATE 4 MG/ML IJ SOLN: 4 mg/mL | INTRAMUSCULAR | Qty: 1 | Fill #0

## 2024-08-20 MED FILL — BRIDION 200 MG/2ML IV SOLN: 200 MG/2ML | INTRAVENOUS | Qty: 2 | Fill #0

## 2024-08-20 MED FILL — BUPIVACAINE HCL (PF) 0.5 % IJ SOLN: 0.5 % | INTRAMUSCULAR | Qty: 30 | Fill #0

## 2024-08-20 MED FILL — IC GREEN 25 MG IJ SOLR: 25 mg | INTRAMUSCULAR | Qty: 10 | Fill #0

## 2024-08-20 MED FILL — MIDAZOLAM HCL 2 MG/2ML IJ SOLN: 2 mg/mL | INTRAMUSCULAR | Qty: 2 | Fill #0

## 2024-08-20 MED FILL — CEFAZOLIN SODIUM 1 G IJ SOLR: 1 g | INTRAMUSCULAR | Qty: 2000 | Fill #0

## 2024-08-20 MED FILL — AKOVAZ 25 MG/5ML IV SOSY: 25 MG/5ML | INTRAVENOUS | Qty: 5 | Fill #0

## 2024-08-20 MED FILL — FENTANYL CITRATE (PF) 250 MCG/5ML IJ SOLN: 250 MCG/5ML | INTRAMUSCULAR | Qty: 5 | Fill #0

## 2024-08-20 MED FILL — DIPRIVAN 200 MG/20ML IV EMUL: 200 MG/20ML | INTRAVENOUS | Qty: 20 | Fill #0

## 2024-08-20 MED FILL — PHENYLEPHRINE HCL (PRESSORS) 10 MG/ML IV SOLN: 10 mg/mL | INTRAVENOUS | Qty: 1 | Fill #0

## 2024-08-20 MED FILL — BD POSIFLUSH 0.9 % IV SOLN: 0.9 % | INTRAVENOUS | Qty: 40 | Fill #0

## 2024-08-20 MED FILL — XYLOCAINE-MPF 2 % IJ SOLN: 2 % | INTRAMUSCULAR | Qty: 5 | Fill #0

## 2024-08-20 MED FILL — ONDANSETRON HCL 4 MG/2ML IJ SOLN: 4 MG/2ML | INTRAMUSCULAR | Qty: 2 | Fill #0

## 2024-08-20 NOTE — Discharge Instructions (Addendum)
 POST-OPERATIVE INSTRUCTIONS  OUTPATIENT SURGERY Robotic CHOLECYSTECTOMY    Today you underwent a robotic cholecystectomy which is usually well tolerated. However, below is a list of instructions which are important for you to follow. If you have any questions, please call your surgeon's office.    1 EATING: Please eat lightly when you go home today for the first 24 hours. You may resume your regular diet after that.    2. EXERCISE: Limit your exercise for the first week, although stairs or other walking is fine. No strenuous activity (No lifting >10-15lbs) for one month.    3. DRESSING: You may shower in 1 day, unless otherwise instructed by your surgeon.  No pools, baths, or hot tubs for 2 weeks.    4. NO LIFTING: No lifting >10lbs for 4 weeks from day of surgery    5. DRIVING: No driving while taking prescription pain medicine, and until post-operative discomfort resolves. Usually you may begin driving within 1-2 weeks.    6. PAIN: A prescription for pain has been given to you or your family. Please use it as prescribed and if this is inadequate, please call your surgeon's office. In some cases, Tylenol or Advil may be adequate; and in that case, you will not need to fill the prescription. Please call for any refills during office hours. Pain medication may cause constipation - Colace or Milk of Magnesia may be used as needed.  May take Tylenol 1000mg  every 6 hours as needed for pain  May take Ibuprofen 600-800mg  every 6 hours as needed for pain      7. WOUND: If you notice any increased redness, swelling, pain or fever, please call the office. If this is noticed at a time after normal office hours, please call our answering service.    8. APPOINTMENT: You may already have a follow up appointment.          If not your surgeon would like to see you in his/her office in 14 days, call to make your follow up    If this appointment has not been made for you prior to your departure from Outpatient Surgery, please call  your surgeon's office to schedule your appointment. If you have any questions or concerns, please do not hesitate to call your surgeon or any other staff member who will be able to assist you.      Joanne E. Glanville, MD, FACS  [facility] at [facility]  [address]   [address]  [place], [place] [address]  P: [phone]  F: [phone]       Over The Counter Pain Medications    To achieve optimal pain control, the below regimen is recommended:  Alternate ibuprofen and acetaminophen/Tylenol on a set schedule, even if you are not experiencing significant pain or discomfort, for the first 5-10 days after surgery, or as advised by your surgeon. You will take medication every three to four hours, alternating between ibuprofen and Tylenol.   These medications are sold over the counter at pharmacies and grocery stores. They do not require a prescription.      Ibuprofen  Ibuprofen is typically sold in 200mg  capsules. Ibuprofen is often sold under the brand names Advil or Motrin, it is the same medication.   It is recommended you take 600-800mg  ibuprofen at a time, so you will need to take 3-4 200mg  capsules at once.   You should take this with food and a glass of water.  Do not take ibuprofen if you have a history of stomach/intestinal ulcers, gastric bypass, or kidney disease.                                                              Tylenol   Acetaminophen/Tylenol is typically sold in 325mg  or 500 tablets. To take 1000mg  at a time, you will need to take two 500mg  tablets or three 325mg  tablets (975mg ).  You can take this on an empty stomach. You do not need to take this with food.  Do not take Acetaminophen/Tylenol if you have a history of liver disease, including hepatitis, cirrhosis, or alcoholism.  Do not consume alcohol while taking Acetaminophen/Tylenol.  Ask your medical provider if you have any questions about which medications are safe to take.    Below is an  example of a medication schedule:  7am: 600-800mg  ibuprofen with breakfast  10 am: 1000mg  acetaminophen/Tylenol  1am: 600-800mg  with lunch  4pm: 1000mg  acetaminophen/Tylenol  7pm:  600-800mg  ibuprofen with dinner  10am: 1000mg  acetaminophen/Tylenol before bed        DISCHARGE SUMMARY from your Nurse      PATIENT INSTRUCTIONS    After general anesthesia or intravenous sedation, for 24 hours or while taking prescription Narcotics:  Limit your activities  Do not drive and operate hazardous machinery  Do not make important personal or business decisions  Do  not drink alcoholic beverages  If you have not urinated within 8 hours after discharge, please contact your surgeon on call.    Report the following to your surgeon:  Excessive pain, swelling, redness or odor of or around the surgical area  Temperature over 100.5  Nausea and vomiting lasting longer than 4 hours or if unable to take medications  Any signs of decreased circulation or nerve impairment to extremity: change in color, persistent  numbness, tingling, coldness or increase pain  Any questions      GOOD HELP TO FIGHT AN INFECTION  Here are a few tip to help reduce the chance of getting an infection after surgery:  Wash Your Hands  Good handwashing is the most important thing you and your caregiver can do.  Wash before and after caring for any wounds.  Dry your hand with a clean towel.  Wash with soap and water for at least 20 seconds. A TIP: sing the Happy Birthday song through one time while washing to help with the timing.  Use a hand sanitizer in between washings.    Shower  When your surgeon says it is OK to take a shower, use a new bar of antibacterial soap (if that is what you use, and keep that bar of soap ONLY for your use), or antibacterial body wash.  Use a clean wash cloth or sponge when you bathe.  Dry off with a clean towel  after every bath - be careful around any wounds, skin staples, sutures or surgical glue over/on wounds.  Do not enter  swimming pools, hot tubs, lakes, rivers and/or ocean until wounds are healed and your doctor/surgeon says it is OK.    Use Clean Sheets  Sleep on freshly laundered sheets after your surgery.  Keep the surgery site covered with a clean, dry bandage (if instructed to do so).  If the bandage becomes  soiled, reapply a new, dry, clean bandage.   Do not allow pets to sleep with you while your wound is healing.    Lifestyle Modification and Controlling Your Blood Sugar  Smoking slows wound healing.  Stop smoking and limit exposure to second-hand smoke.  High blood sugar slows wound healing.  Eat a well-balanced diet to provide proper nutrition while healing  Monitor your blood sugar (if you are a diabetic) and take your medications as you are suppose to so you can control you blood sugar after surgery.      COUGH AND DEEP BREATHE    Breathing deeply and coughing are very important exercises to do after surgery.  Deep breathing and coughing open the little air tubes and air sacks in your lungs.       You take deep breaths every day.  You may not even notice - it is just something you do when you sigh or yawn.  It is a natural exercise you do to keep these air passages open.      After surgery, take deep breaths and cough, on purpose.    DIRECTIONS:  Take 10 to 15 slow deep breaths every hour while awake.  Breathe in deeply, and hold it for 2 seconds.  Exhale slowly through puckered lips, like blowing up a balloon.  After every 4th or 5th deep breath, hug your pillow to your chest or belly and give a hard, deep cough.      Yes, it will probably hurt.  But doing this exercise is a very important part of healing after surgery.  Take your pain medicine to help you do this exercise without too much pain.    Coughing and deep breathing help prevent bronchitis and pneumonia after surgery.  If you had chest or belly surgery, use a pillow as a hug buddy and hold it tightly to your chest or belly when you cough.       ANKLE  PUMPS    Ankle pumps increase the circulation of oxygenated blood to your lower extremities and decrease your risk for circulation problems such as blood clots. They also stretch the muscles, tendons and ligaments in your foot and ankle, and prevent joint contracture in the ankle and foot, especially after surgeries on the legs.    It is important to do ankle pump exercises regularly after surgery because immobility increases your risk for developing a blood clot.  Your doctor may also have you take an Aspirin for the next few days as well.      If your doctor did not ask you to take an Aspirin, consult with him before starting Aspirin therapy on your own.    The exercise is quite simple.     Slowly point your foot forward, feeling the muscles on the top of your lower leg stretch, and hold this position for 5 seconds.                  Next, pull your foot back toward you as far as possible, stretching the calf muscles, and hold that position for 5 seconds.                 Repeat with the other foot.  Perform 10 repetitions every hour while awake for both ankles if possible (down and then up with the foot once is one repetition).    You should feel gentle stretching of the muscles in your lower leg when doing this exercise.  If you  feel pain, or your range of motion is limited, don't push too hard.  Only go the limit your joint and muscles will let you go.  If you have increasing pain, progressively worsening leg warmth or swelling, STOP the exercise and call your doctor.           MEDICATION AND SIDE EFFECT GUIDE    The [employer] MEDICATION AND SIDE EFFECT GUIDE was provided to the PATIENT AND CARE PROVIDER.  Information provided includes instruction about drug purpose and common side effects for the following medications:   Oxycodone        These are general instructions for a healthy lifestyle:    *   Please give a list of your current medications to your Primary Care Provider.  *   Please update this list  whenever your medications are discontinued, doses are changed, or new medications (including over-the-counter products) are added.  *   Please carry medication information at all times in case of emergency situations.      About Smoking  No smoking / No tobacco products  Avoid exposure to second hand smoke     Surgeon General's Warning:  Quitting smoking now greatly reduces serious risk to your health.    Obesity, smoking, and sedentary lifestyle greatly increases your risk for illness and disease.  A healthy diet, regular physical exercise & weight monitoring are important for maintaining a healthy lifestyle.      Congestive Heart Failure  You may be retaining fluid if you have a history of heart failure or if you experience any of the following symptoms:  Weight gain of 3 pounds or more overnight or 5 pounds in a week, increased swelling in your hands or feet or shortness of breath while lying flat in bed.  Please call your doctor as soon as you notice any of these symptoms; do not wait until your next office visit.      Recognize signs and symptoms of STROKE:  F -  Face looks uneven  A -  Arms unable to move or move evenly  S -  Speech slurred or non-existent  T -  Time-call 911 as soon as signs and symptoms begin-DO NOT go          back to bed or wait to see if you get better-TIME IS BRAIN.      Warning Signs of HEART ATTACK   Call 911 if you have these symptoms:    Chest discomfort. Most heart attacks involve discomfort in the center of the chest that lasts more than a few minutes, or that goes away and comes back. It can feel like uncomfortable pressure, squeezing, fullness, or pain.  Discomfort in other areas of the upper body. Symptoms can include pain or discomfort in one or both arms, the back, neck, jaw, or stomach.  Shortness of breath with or without chest discomfort.  Other signs may include breaking out in a cold sweat, nausea, or lightheadedness.    Don't wait more than five minutes to call 911 -  MINUTES MATTER! Fast action can save your life. Calling 911 is almost always the fastest way to get lifesaving treatment. Emergency Medical Services staff can begin treatment when they arrive -- up to an hour sooner than if someone gets to the hospital by car.           CONTENTS FOUND IN YOUR DISCHARGE ENVELOPE:  [x]      Surgeon and Hospital Discharge Instructions  []   [facility] Care Provider Card  [x]      Medication & Side Effect Guide            (your newly prescribed medications have been marked/highlighted showing the most common side effects from   the classes of drugs on your prescriptions)  [x]      Medication Prescription(s) x 1 ( []  These have been sent electronically to your pharmacy by your surgeon,   - OR -       your surgeon has already provided these to you during a previous/pre-op office visit)  []      [employer] Education Information  []      Physical Therapy Prescription  []      Follow-up Appointment Cards  []      Surgery-related Pictures/Media  []      Pain block and/or block with On-Q Catheter from Anesthesia Service (information included in your instructions above)  []      Medical device information sheets/pamphlets from their manufacturer   []      School/work excuse note.  []      Driver/parent work excuse note.

## 2024-08-20 NOTE — Anesthesia Post-Procedure Evaluation (Signed)
 Department of Anesthesiology  Postprocedure Note    Patient: Leah Huff  MRN: [id number]  Birthdate: 10/07/1986  Date of evaluation: 08/20/2024    Procedure Summary       Date: 08/20/24 Room / Location: [facility] MAIN OR F5 / [facility] MAIN OR    Anesthesia Start: 0733 Anesthesia Stop: 0857    Procedure: ROBOTIC CHOLECYSTECTOMY WITH INDOCYANINE GREEN Diagnosis:       Biliary colic      (Biliary colic [K80.50])    Surgeons: Chrystal Elijah BRAVO, MD Responsible Provider: Claudene Rockey NOVAK, MD    Anesthesia Type: general, TIVA ASA Status: 2            Anesthesia Type: No value filed.    Aldrete Phase I: Aldrete Score: 8    Aldrete Phase II:      Anesthesia Post Evaluation    Patient location during evaluation: PACU  Patient participation: complete - patient participated  Level of consciousness: awake  Airway patency: patent  Nausea & Vomiting: no vomiting and no nausea  Cardiovascular status: hemodynamically stable  Respiratory status: acceptable  Hydration status: stable  Pain management: adequate      No notable events documented.

## 2024-08-20 NOTE — Op Note (Signed)
 Operative Note      Patient: Leah Huff  Date of Birth: 08/28/1986  MRN: [id number]    Date of Procedure: 06-Sep-2024    Pre-Op Diagnosis Codes:      * Biliary colic [K80.50]    Post-Op Diagnosis: Same       Procedure(s):  ROBOTIC CHOLECYSTECTOMY WITH INDOCYANINE GREEN    Surgeon(s):  Chrystal Elijah BRAVO, MD    Assistant:   Surgical Assistant: Bethena Kirsch    Anesthesia: General    Estimated Blood Loss (mL): Minimal    Complications: None    Specimens:   ID Type Source Tests Collected by Time Destination   1 :  Tissue Gallbladder SURGICAL PATHOLOGY Chrystal Elijah BRAVO, MD 09/06/2024 [phone]        Implants:  none      Drains: * No LDAs found *    Findings:  Present At Time Of Surgery (PATOS) (choose all levels that have infection present):  No infection present  Other Findings: cholelithiasis    Detailed Description of Procedure:       PROCEDURE: Patient was brought in the room and placed supine on the operating table.  After general anesthesia induction and placement of endotracheal tube, patient's  abdomen was prepped and draped in standard surgical fashion. The  laparoscopic camera and CO2 insufflation apparatus were affixed to the  drapes in the usual fashion.    Through a supraumbilical incision, a Veress needle was introduced and its  intraperitoneal position was confirmed with low intra-abdominal initial pressures. CO2 insufflation was connected and pneumoperitoneum was created and maintained at 15 mmHg. An 8-mm robotic trocar was introduced and 8mm robotic camera  Was passed through and immediate area was inspected and it was confirmed that there was no  intraabdominal injury during introduction of pneumoperitoneum and the  trocar.    Under direct vision, 2 8-mm robotic ports were positioned, one to the  Left upper quadrant, one at the right upper quadrant, and another 5mm port between the umbilical and right upper quadrant port.   Patient was positioned in reverse Trendelenburg with a tilt to  the  left.    The fundus was grasped and lifted up towards the diaphragm. The  infundibulum was retracted laterally and inferiorly after releasing the  adhesions.    The junction of the cystic duct and gallbladder was clearly identified, and  an adequate length of the cystic duct was dissected out. Similarly, the cystic  artery was also dissected out and an adequate length was displayed. Critical view of Calot's triangle was obtained and the structures were clearly identified.  After this was satisfactorily done, the cystic duct was doubly clipped on proximal side and single clip on distal side and divided. The cystic artery was also singly clipped on both sides and divided.   The gallbladder was then gently dissected off from the liver bed  using electrocautery and achieving hemostasis. as the dissection proceeded  upwards towards the fundus. The gallbladder was thus removed from its bed.  Using a 8mm Endobag through the left upper quadrant port, the gallbladder  was removed intact.    Liver bed was inspected. No bleeding or bile leak was noted.     The left upper quadrant port fascia was closed with 0 Vicryl with a figure of 8 stitch using a stortz device. All incisions had skin approximated with subcuticular 4-0 Vicryl   with Dermabond to the skin. Marcaine  was infiltrated into each port site.  Electronically signed by Elijah FORBES [employer], MD on 08/20/2024 at 8:41 AM

## 2024-08-20 NOTE — Anesthesia Pre-Procedure Evaluation (Signed)
 Department of Anesthesiology  Preprocedure Note       Name:  Leah Huff   Age:  38 y.o.  DOB:  February 03, 1986                                          MRN:  [id number]         Date:  08/20/2024      Surgeon: Clotilde):  Chrystal Elijah BRAVO, MD    Procedure: Procedure(s):  ROBOTIC CHOLECYSTECTOMY WITH INDOCYANINE GREEN    Medications prior to admission:   Prior to Admission medications   Medication Sig Start Date End Date Taking? Authorizing Provider   Magnesium Oxide (NATRUL MAGNESIUM PO) Take 1 tablet by mouth every evening   Yes [provider]   Cyanocobalamin (B-12 PO) Take 1 tablet by mouth every evening   Yes [provider]   ondansetron  (ZOFRAN ) 4 MG tablet Take 1 tablet by mouth 3 times daily as needed for Nausea or Vomiting 06/14/24  Yes Hutchinson, Stephanie, DO   ketoconazole  (NIZORAL ) 2 % shampoo SHAMPOO INTO HAIR ONCE DAILY X 2-4 WKS 02/09/24  Yes Katina Perkins, APRN - CNP   metFORMIN (GLUCOPHAGE-XR) 500 MG extended release tablet 1 tablet every evening 08/31/23  Yes [provider]   escitalopram  (LEXAPRO ) 10 MG tablet Take 1 tablet by mouth daily  Patient taking differently: Take 1 tablet by mouth every evening 09/08/23  Yes Katina Perkins, APRN - CNP   PROBIOTIC PRODUCT PO Take 1 tablet by mouth every evening   Yes [provider]   loratadine (CLARITIN) 10 MG tablet Take 1 tablet by mouth every evening   Yes [provider]   ketorolac  (TORADOL ) 10 MG tablet Take 1 tablet by mouth every 6 hours as needed for Pain Do not take for more than 4 days  Patient not taking: Reported on 08/12/2024 06/14/24   Dwane Krabbe, DO       Current medications:    Current Facility-Administered Medications   Medication Dose Route Frequency Provider Last Rate Last Admin    naloxone 0.4 mg in 10 mL sodium chloride  syringe   IntraVENous PRN Britton, Alison E, MD        lactated ringers infusion   IntraVENous Continuous Britton, Alison E, MD        HYDROmorphone  (DILAUDID) injection 0.5 mg  0.5 mg IntraVENous Q5 Min PRN Britton, Alison E, MD        HYDROmorphone (DILAUDID) injection 0.5 mg  0.5 mg IntraVENous Q5 Min PRN Jenita Donald BRAVO, MD        ondansetron  (ZOFRAN ) injection 4 mg  4 mg IntraVENous Once PRN Britton, Alison E, MD        diphenhydrAMINE (BENADRYL) injection 12.5 mg  12.5 mg IntraVENous Once PRN Britton, Alison E, MD        lidocaine  PF 1 % injection 1 mL  1 mL IntraDERmal Once PRN Britton, Alison E, MD        fentaNYL (SUBLIMAZE) injection 100 mcg  100 mcg IntraVENous Once PRN Britton, Alison E, MD        lactated ringers infusion   IntraVENous Continuous Britton, Alison E, MD        midazolam PF (VERSED) IntraVENous 2 mg  2 mg IntraVENous Once PRN Britton, Alison E, MD        indocyanine green (IC-GREEN) syringe 5 mg  5 mg IntraVENous Once Glanville, Joanne E, MD        sodium chloride  flush 0.9 % injection 5-40 mL  5-40 mL IntraVENous 2 times per day Glanville, Joanne E, MD        sodium chloride  flush 0.9 % injection 5-40 mL  5-40 mL IntraVENous PRN Glanville, Joanne E, MD        0.9 % sodium chloride  infusion   IntraVENous PRN Glanville, Joanne E, MD        ceFAZolin (ANCEF) 2,000 mg in sterile water 20 mL IV syringe  2,000 mg IntraVENous On Call to OR Glanville, Joanne E, MD        lidocaine  PF 1 % injection 1 mL  1 mL IntraDERmal Once PRN Darius Lundberg B, MD        fentaNYL (SUBLIMAZE) injection 100 mcg  100 mcg IntraVENous Once PRN Bodie Abernethy B, MD        lactated ringers infusion   IntraVENous Continuous Claudene Rockey NOVAK, MD        midazolam PF (VERSED) IntraVENous 2 mg  2 mg IntraVENous Once PRN Claudene Rockey NOVAK, MD           Allergies:    Allergies   Allergen Reactions    Adhesive Tape Hives and Itching     PT IS ALLERGIC TO ADHESIVE TAPE    Silicone Hives and Itching     PT IS ALLERGIC TO ADHESIVE TAPE    Fish Allergy Diarrhea     Patient is allergic to catfish and porta bella mushrooms       Problem List:    Patient Active Problem List   Diagnosis  Code    Encounter for wellness examination in adult Z00.00    Family history of diabetes mellitus Z83.3    Family history of thyroid disease Z83.49    Arthralgia of right elbow M25.521    Arthralgia of both knees M25.561, M25.562    Anxiety F41.9    Seborrheic dermatitis L21.9    Biliary colic K80.50       Past Medical History:        Diagnosis Date    Abnormal vaginal cytology     Anxiety     Presence of Mirena IUD     Placed six years ago       Past Surgical History:        Procedure Laterality Date    CESAREAN SECTION      2013 and 2019    WISDOM TOOTH EXTRACTION         Social History:    Social History     Tobacco Use    Smoking status: Never    Smokeless tobacco: Never   Substance Use Topics    Alcohol use: Not Currently     Alcohol/week: 0.0 - 2.0 standard drinks of alcohol     Comment: ocassionally                                Counseling given: Not Answered      Vital Signs (Current):   Vitals:    08/12/24 1458   Weight: 105.7 kg (233 lb)   Height: 1.651 m (5' 5)  BP Readings from Last 3 Encounters:   06/27/24 117/81   06/14/24 134/83   09/08/23 118/82       NPO Status:                                                                                 BMI:   Wt Readings from Last 3 Encounters:   08/12/24 105.7 kg (233 lb)   06/27/24 109.4 kg (241 lb 3.2 oz)   06/14/24 110.7 kg (244 lb)     Body mass index is 38.77 kg/m.    CBC:   Lab Results   Component Value Date/Time    WBC 8.1 06/14/2024 05:00 AM    RBC 4.82 06/14/2024 05:00 AM    HGB 14.0 06/14/2024 05:00 AM    HCT 41.3 06/14/2024 05:00 AM    MCV 85.7 06/14/2024 05:00 AM    RDW 13.1 06/14/2024 05:00 AM    PLT 316 06/14/2024 05:00 AM       CMP:   Lab Results   Component Value Date/Time    NA 139 06/14/2024 05:00 AM    K 4.6 06/14/2024 05:00 AM    CL 106 06/14/2024 05:00 AM    CO2 22 06/14/2024 05:00 AM    BUN 9 06/14/2024 05:00 AM    CREATININE 0.76 06/14/2024 05:00 AM    AGRATIO 1.8 12/28/2022 11:52 AM     LABGLOM >90 06/14/2024 05:00 AM    LABGLOM 95 12/28/2022 11:52 AM    GLUCOSE 119 06/14/2024 05:00 AM    GLUCOSE 82 12/28/2022 11:52 AM    CALCIUM 9.2 06/14/2024 05:00 AM    BILITOT 0.3 06/14/2024 05:00 AM    ALKPHOS 58 06/14/2024 05:00 AM    ALKPHOS 69 12/28/2022 11:52 AM    AST 20 06/14/2024 05:00 AM    ALT 13 06/14/2024 05:00 AM       POC Tests:   Recent Labs     08/20/24  9367   POCGLU 101       Coags: No results found for: PROTIME, INR, APTT    HCG (If Applicable):   Lab Results   Component Value Date    PREGTESTUR Negative 08/20/2024        ABGs: No results found for: PHART, PO2ART, PCO2ART, HCO3ART, BEART, O2SATART     Type & Screen (If Applicable):  No results found for: ABORH, LABANTI    Drug/Infectious Status (If Applicable):  Lab Results   Component Value Date/Time    HEPCAB Non Reactive 12/28/2022 11:52 AM       COVID-19 Screening (If Applicable): No results found for: COVID19        Anesthesia Evaluation    Patient summary reviewed and Nursing notes reviewed     no history of anesthetic complications:  Airway:  Mallampati: II  TM distance: >3 FB   Neck ROM: full    Mouth opening: > = 3 FB   Dental:     (+) bridge      Pulmonary:   Negative Pulmonary ROS  breath sounds clear to auscultation           Cardiovascular:   Negative CV ROS  Exercise tolerance: good (>4  METS)          ECG reviewed  Rhythm: regular  Rate: normal                Neuro/Psych:    Negative Neuro/Psych ROS             GI/Hepatic/Renal:    (+)           obesity:        Endo/Other:  Negative Endo/Other ROS                    Abdominal:              Vascular:  negative vascular ROS.       Other Findings:            Anesthesia Plan      general and TIVA     ASA 2       Induction: intravenous.    MIPS: Postoperative opioids intended and Prophylactic antiemetics administered.  Anesthetic plan and risks discussed with patient.      Plan discussed with CRNA.                  Rockey KATHEE Sharps, MD   08/20/2024

## 2024-08-20 NOTE — Progress Notes (Signed)
 Patient Fall protocol  Yellow arm band applied to patient.  Yellow non-skid socks placed on Patient.    Bed in low position, all side rails up, call bell in reach.  Patient and family instructed on "Call Don't Fall" Protocol   -Use your call bell, wait for assistance, staff (not Family) will assist you to get up and move about.  Patient and Family verbalize understanding of Fall Precautions and the " Call Don't Fall" Protocol

## 2024-08-20 NOTE — H&P (Signed)
 Surgery H & P / Consult          Assessment and Plan:   Leah Huff is a 38 y.o. female who is here for robotic cholecystectomy  Postop care explained  All questions answered        Signed By: Elijah FORBES Merck, MD     August 20, 2024               Subjective:      Leah Huff is a 38 y.o. female who presents for robotic cholecystectomy due to having episodes of biliary colic.  Patient reports a few episodes since seen in office    Past Medical History:   Diagnosis Date    Abnormal vaginal cytology     Anxiety     Presence of Mirena IUD     Placed six years ago     Past Surgical History:   Procedure Laterality Date    CESAREAN SECTION      2013 and 2019    WISDOM TOOTH EXTRACTION        Family History   Problem Relation Age of Onset    Thyroid Disease Mother     Hypertension Mother     Cancer Maternal Grandmother      Social History     Socioeconomic History    Marital status: Married     Spouse name: None    Number of children: None    Years of education: None    Highest education level: None   Tobacco Use    Smoking status: Never    Smokeless tobacco: Never   Vaping Use    Vaping status: Never Used   Substance and Sexual Activity    Alcohol use: Not Currently     Alcohol/week: 0.0 - 2.0 standard drinks of alcohol     Comment: ocassionally    Drug use: Yes     Comment: gummies    Sexual activity: Yes     Partners: Male     Social Drivers of Psychologist, prison and probation services Strain: Low Risk  (12/28/2022)    Overall Financial Resource Strain (CARDIA)     Difficulty of Paying Living Expenses: Not hard at all   Food Insecurity: No Food Insecurity (12/28/2022)    Hunger Vital Sign     Worried About Running Out of Food in the Last Year: Never true     Ran Out of Food in the Last Year: Never true   Transportation Needs: Unknown (12/28/2022)    PRAPARE - Transportation     Lack of Transportation (Non-Medical): No   Physical Activity: Insufficiently Active (06/22/2021)    Received from [facility]    Exercise Vital  Sign     On average, how many days per week do you engage in moderate to strenuous exercise (like a brisk walk)?: 7 days     On average, how many minutes do you engage in exercise at this level?: 20 min   Stress: No Stress Concern Present (06/22/2021)    Received from [facility] - Occupational Stress Questionnaire     Feeling of Stress : Only a little    Received from [employer]    Social Network    Received from [facility]    HITS   Housing Stability: Unknown (12/28/2022)    Housing Stability Vital Sign     Unstable Housing in the Last Year: No  Current Facility-Administered Medications   Medication Dose Route Frequency    naloxone 0.4 mg in 10 mL sodium chloride  syringe   IntraVENous PRN    lactated ringers infusion   IntraVENous Continuous    HYDROmorphone (DILAUDID) injection 0.5 mg  0.5 mg IntraVENous Q5 Min PRN    HYDROmorphone (DILAUDID) injection 0.5 mg  0.5 mg IntraVENous Q5 Min PRN    ondansetron  (ZOFRAN ) injection 4 mg  4 mg IntraVENous Once PRN    diphenhydrAMINE (BENADRYL) injection 12.5 mg  12.5 mg IntraVENous Once PRN    lidocaine  PF 1 % injection 1 mL  1 mL IntraDERmal Once PRN    fentaNYL (SUBLIMAZE) injection 100 mcg  100 mcg IntraVENous Once PRN    lactated ringers infusion   IntraVENous Continuous    midazolam PF (VERSED) IntraVENous 2 mg  2 mg IntraVENous Once PRN    indocyanine green (IC-GREEN) syringe 5 mg  5 mg IntraVENous Once    sodium chloride  flush 0.9 % injection 5-40 mL  5-40 mL IntraVENous 2 times per day    sodium chloride  flush 0.9 % injection 5-40 mL  5-40 mL IntraVENous PRN    0.9 % sodium chloride  infusion   IntraVENous PRN    ceFAZolin (ANCEF) 2,000 mg in sterile water 20 mL IV syringe  2,000 mg IntraVENous On Call to OR    lidocaine  PF 1 % injection 1 mL  1 mL IntraDERmal Once PRN    fentaNYL (SUBLIMAZE) injection 100 mcg  100 mcg IntraVENous Once PRN    lactated ringers infusion   IntraVENous Continuous    midazolam PF  (VERSED) IntraVENous 2 mg  2 mg IntraVENous Once PRN      Allergies   Allergen Reactions    Adhesive Tape Hives and Itching     PT IS ALLERGIC TO ADHESIVE TAPE    Silicone Hives and Itching     PT IS ALLERGIC TO ADHESIVE TAPE    Fish Allergy Diarrhea     Patient is allergic to catfish and porta bella mushrooms       Review of Systems:REVIEW OF SYSTEMS:     []      Unable to obtain  ROS due to  []     mental status change  []     sedated   []     intubated   [x]     Total of 12 systems reviewed as follows:    Constitutional: neg for fevers, chills, weight loss, malaise  Eyes: negative for blurry vision  Ears, nose, mouth, throat, and face: negative for sore throat  Respiratory: negative for SOB  Cardiovascular: negative for CP  Gastrointestinal: negative for nausea, vomiting, abdominal pain, diarrhea, constipation, melena, hematochezia  Genitourinary: negative for dysuria  Integument/breast: neg for skin rash  Hematologic/lymphatic: neg for bruising  Musculoskeletal: negative for muscle aches  Neurological: no dizziness or h/a    Objective:      Patient Vitals for the past 8 hrs:   BP Temp Temp src Pulse Resp SpO2 Weight   08/20/24 0617 133/80 98.6 F (37 C) Oral 73 16 100 % 107.9 kg (237 lb 14 oz)       Temp (24hrs), Avg:98.6 F (37 C), Min:98.6 F (37 C), Max:98.6 F (37 C)      Physical Exam:  General:  Alert, cooperative, no distress, appears stated age.   Eyes:  Conjunctivae/corneas clear.    Nose: Nares normal. Septum midline   Mouth/Throat: Lips, mucosa, and tongue normal.    Neck: Supple,  symmetrical, trachea midline   Lungs:   Clear to auscultation bilaterally.   Heart:  Regular rate and rhythm   Abdomen:   Soft, non-tender, non-distended, no rebound, no guarding, normal bowel sounds   Extremities: Extremities normal, atraumatic, no cyanosis or edema.   Skin: Skin color, texture, turgor normal. No rashes or lesions   Neuro: Alert, oriented, speech clear     Labs: No results for input(s): WBC, HGB, HCT,  PLT in the last 72 hours.  No results for input(s): NA, K, CL, CO2, GLU, BUN, MG, PHOS, ALT in the last 72 hours.    Invalid input(s): CREA, CA, ALB, TBIL, TBILI, SGOT  No results for input(s): INR in the last 72 hours.

## 2024-09-04 ENCOUNTER — Ambulatory Visit: Admit: 2024-09-04 | Discharge: 2024-09-04 | Payer: BLUE CROSS/BLUE SHIELD | Attending: Surgery

## 2024-09-04 VITALS — BP 116/79 | HR 66 | Temp 98.40000°F | Resp 16 | Ht 65.0 in | Wt 237.8 lb

## 2024-09-04 DIAGNOSIS — Z9049 Acquired absence of other specified parts of digestive tract: Principal | ICD-10-CM

## 2024-09-04 NOTE — Progress Notes (Signed)
"  Surgery Progress Note    09/04/2024    had concerns including Post-Op Check (2 week post-op check s/p robotic cholecystectomy with indocyanine green  08/20/24).     Subjective:     Patient is a 38 y.o. female who is following up for robotic cholecystectomy 2 weeks ago.  Doing well with no acute complaints.  Still has some left-sided incisional tenderness but improving.  Tolerating diet without nausea vomiting or diarrhea.  No wound healing issues.      Past Medical History:   Diagnosis Date    Abnormal vaginal cytology     Anxiety     Presence of Mirena IUD     Placed six years ago        Past Surgical History:   Procedure Laterality Date    CESAREAN SECTION      2013 and 2019    CHOLECYSTECTOMY, LAPAROSCOPIC N/A 08/20/2024    ROBOTIC CHOLECYSTECTOMY WITH INDOCYANINE GREEN  performed by Chrystal Elijah BRAVO, MD at [facility] MAIN OR    WISDOM TOOTH EXTRACTION            Objective:     Vitals:    09/04/24 1539   BP: 116/79   Pulse: 66   Resp: 16   Temp: 98.4 F (36.9 C)   SpO2: 95%     Body mass index is 39.57 kg/m.  Wt Readings from Last 3 Encounters:   09/04/24 107.9 kg (237 lb 12.8 oz)   08/20/24 107.9 kg (237 lb 14 oz)   06/27/24 109.4 kg (241 lb 3.2 oz)        General: No acute distress, conversant  Eyes: PERRLA, no scleral icterus  HENT: Normocephalic, PEERLA  GI: Soft, NT, ND, incisions clean, dry, intact  Psych: Appropriate mood and affect      Assessment / Plan:         38 y.o. y/o female   Doing well postop  Pathology reviewed  Satisfactory post-op course  No heavy lifting (>15lbs) for 4 weeks from date of surgery      Prentice LITTIE Hoover, PA   [facility]   "

## 2024-09-04 NOTE — Progress Notes (Signed)
"  Identified pt with two pt identifiers (name and DOB). Reviewed chart in preparation for visit and have obtained necessary documentation.    Leah Huff is a 38 y.o. female  Chief Complaint   Patient presents with    Post-Op Check     2 week post-op check s/p robotic cholecystectomy with indocyanine green  08/20/24     BP 116/79 (BP Site: Right Upper Arm, Patient Position: Sitting, BP Cuff Size: Large Adult)   Pulse 66   Temp 98.4 F (36.9 C) (Oral)   Resp 16   Ht 1.651 m (5' 5)   Wt 107.9 kg (237 lb 12.8 oz)   LMP 06/10/2024 (Approximate)   SpO2 95%   BMI 39.57 kg/m     1. Have you been to the ER, urgent care clinic since your last visit?  Hospitalized since your last visit?no    2. Have you seen or consulted any other health care providers outside of the [facility] since your last visit?  Include any pap smears or colon screening. no  "

## 2024-09-20 ENCOUNTER — Ambulatory Visit: Admit: 2024-09-20 | Discharge: 2024-09-20 | Payer: BLUE CROSS/BLUE SHIELD

## 2024-09-20 MED ORDER — KETOCONAZOLE 2 % EX SHAM
2 | CUTANEOUS | 10 refills | 30.00000 days | Status: AC
Start: 2024-09-20 — End: ?

## 2024-09-20 MED ORDER — ESCITALOPRAM OXALATE 10 MG PO TABS
10 | ORAL_TABLET | Freq: Every evening | ORAL | 3 refills | 90.00000 days | Status: AC
Start: 2024-09-20 — End: ?

## 2024-09-20 MED ORDER — METFORMIN HCL ER 500 MG PO TB24
500 | ORAL_TABLET | Freq: Every evening | ORAL | 1 refills | 90.00000 days | Status: AC
Start: 2024-09-20 — End: ?

## 2024-09-20 NOTE — Patient Instructions (Signed)
"       Well Visit, Ages 25 to 52: Care Instructions  Well visits can help you stay healthy. Your doctor has checked your overall health and may have suggested ways to take good care of yourself. Your doctor also may have recommended tests. You can help prevent illness with healthy eating, good sleep, vaccinations, regular exercise, and other steps.    Get the tests that you and your doctor decide on. Depending on your age and risks, examples might include screening for diabetes; hepatitis C; HIV; and cervical, breast, lung, and colon cancer. Screening helps find diseases before any symptoms appear.   Eat healthy foods. Choose fruits, vegetables, whole grains, lean protein, and low-fat dairy foods. Limit saturated fat and reduce salt.     Limit alcohol. Men should have no more than 2 drinks a day. Women should have no more than 1. For some people, no alcohol is the best choice.   Exercise. Get at least 30 minutes of exercise on most days of the week. Walking can be a good choice.     Reach and stay at your healthy weight. This will lower your risk for many health problems.   Take care of your mental health. Try to stay connected with friends, family, and community, and find ways to manage stress.     If you're feeling depressed or hopeless, talk to someone. A counselor can help. If you don't have a counselor, talk to your doctor.   Talk to your doctor if you think you may have a problem with alcohol or drug use. This includes prescription medicines, marijuana, and other drugs.     Avoid tobacco and nicotine: Don't smoke, vape, or chew. If you need help quitting, talk to your doctor.   Practice safer sex. Getting tested, using condoms or dental dams, and limiting sex partners can help prevent STIs.     Use birth control if it's important to you to prevent pregnancy. Talk with your doctor about your choices and what might be best for you.   Prevent problems where you can. Protect your skin from too much sun, wash your  hands, brush your teeth twice a day, and wear a seat belt in the car.   Where can you learn more?  Go to [employer] and enter P072 to learn more about Well Visit, Ages 62 to 71: Care Instructions.  Current as of: May 21, 2024  Content Version: 14.6   2024-2025 [place], .   Care instructions adapted under license by [facility]. If you have questions about a medical condition or this instruction, always ask your healthcare professional. Romayne Alderman, [facility], disclaims any warranty or liability for your use of this information.    "

## 2024-09-20 NOTE — Progress Notes (Signed)
 "    Leah Huff (DOB:  09-17-1986) is a 38 y.o. female,Established patient, here for evaluation of the following chief complaint(s):  Annual Exam    Presents for wellness and chronic disease management. Understands this is considered two appointments and there may be a copay for the chronic disease management portion of the visit.     Subjective     Irritable bowel  Proceeding with his gallbladder surgery with improvement noted although still gets intermittent looser stools and abdominal discomfort that she feels is triggered by certain foods although somewhat uncertain as to very specific triggers.  She inquires as to possible allergy testing for both gastrointestinal allergies/food allergies as well as for recent worsening of her allergic rhinitis which she is currently treating with azelastine nasal spray as needed.  Previous regimen includes Flonase although she did not feel like this was very effective.  She also takes an over-the-counter antihistamine.  Does take additional supplement of B12 although no known history of B12 deficiency and does sometimes take elderberry supplement as needed with weather changes or around cold/flu season.  Interim recent gallbladder surgery with abdominal soreness since but overall much better.     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  A1c test completed through weight management office and noted to have impaired fasting glucose without diabetes  Cervical cancer screening: Follows with gynecologist and will request these records     Exercise: was going regularly prior to surgery and disrupted by recent gb surgery but  recovering well overall  Diet: Varied with regular vegetable intake, some recent indiscretion over the past week  Does follow with weight management program through her gynecologist's office.  Current medication metformin  at 500 mg extended release daily although minimal appetite reduction compared to previous GLP-1 injections that were discontinued due to cost increase (not associated with irritable bowel symptoms above.)      Care Team  Ob/gyn -  Dr. Devora at [facility]- for both WWE/ weight management   Allergy & Immunology - referred today for consideration of allergy  testing  Dental - up-to-date and scheduled q61m  Eye specialist - routine eye check ups         Objective   Vitals:    09/20/24 1106   BP: 120/72   Pulse: 64   Temp: 97.7 F (36.5 C)   TempSrc: Temporal   SpO2: 97%   Weight: 108.7 kg (239 lb 9.6 oz)   Height: 1.651 m (5' 5)      No results found for: VITAMINB12  Lab Results   Component Value Date/Time    NA 139 06/14/2024 05:00 AM    K 4.6 06/14/2024 05:00 AM    CL 106 06/14/2024 05:00 AM    CO2 22 06/14/2024 05:00 AM    BUN 9 06/14/2024 05:00 AM    CREATININE 0.76 06/14/2024 05:00 AM    GLUCOSE 119 06/14/2024 05:00 AM    GLUCOSE 82 12/28/2022 11:52 AM    CALCIUM 9.2 06/14/2024 05:00 AM    LABGLOM >90 06/14/2024 05:00 AM    LABGLOM 95 12/28/2022 11:52 AM        Physical  Exam  Vitals and nursing note reviewed.   Constitutional:       Appearance: Normal appearance.   HENT:      Head: Normocephalic and atraumatic.      Nose: Nose normal.   Eyes:      Conjunctiva/sclera: Conjunctivae normal.      Pupils: Pupils are equal, round, and reactive to light.   Cardiovascular:      Rate and Rhythm: Normal rate and regular rhythm.      Heart sounds: Normal heart sounds.   Pulmonary:      Effort: Pulmonary effort is normal.      Breath sounds: Normal breath sounds. No wheezing, rhonchi or rales.   Musculoskeletal:      Cervical back: Normal range of motion and neck supple.   Skin:     General: Skin is warm and  dry.   Neurological:      General: No focal deficit present.      Mental Status: She is alert and oriented to person, place, and time.   Psychiatric:         Behavior: Behavior normal.                Assessment & Plan  Allergic rhinitis, unspecified seasonality, unspecified trigger   Chronic, not at goal (unstable), referred to allergy/ENT specialist but also recommended nasal saline rinse with distilled water     Orders:    [facility] Ear, Nose, Throat, and Allergy Care, [facility]    Class 2 drug-induced obesity with serious comorbidity and body mass index (BMI) of 39.0 to 39.9 in adult   Chronic, not at goal (unstable), pursuing additional options for approval of previous injection medication and changes for insurance coverage through her weight management specialist.  Reasonable in the interim to try increasing metformin  with refill sent to see if this better helps with appetite reduction. Med benefits/potential side effects/risks/alternatives reviewed.     Orders:    metFORMIN  (GLUCOPHAGE -XR) 500 MG extended release tablet; Take 2 tablets by mouth every evening    Prediabetes   Continue metformin  with dose adjustment as noted above    Orders:    metFORMIN  (GLUCOPHAGE -XR) 500 MG extended release tablet; Take 2 tablets by mouth every evening    Seborrheic dermatitis   Chronic, at goal (stable), continue current treatment plan    Orders:    ketoconazole  (NIZORAL ) 2 % shampoo; Shampoo into hair once daily x 2-4 wks    Routine general medical examination at a health care facility   Annual today  Health history/risk factors reviewed with patient counseling on recommended screenings and preventive care including healthy diet and regular exercise.          Immunization due       Orders:    Influenza, FLUCELVAX Trivalent, (age 17 mo+) IM, Preservative Free, 0.5mL    Anxiety   Chronic, at goal (stable), continue current treatment plan    Orders:    escitalopram  (LEXAPRO ) 10 MG tablet; Take 1 tablet by  mouth every evening    Marijuana use   Discussed with patient concerns regarding regular marijuana use.  And advised minimizing or avoiding use entirely.         Irritable bowel syndrome with diarrhea   Chronic, not at goal (unstable), possible dysregulation with gut brain axis.  Possible food sensitivity considered with referral to allergy specialist.  Also recommended keeping a diet log and trial of FODMAP diet to help determine food triggers.  Symptoms proceeded cholecystectomy although recent surgery may also impact clear assessment of food triggers           Return in about 1 year (around 09/20/2025) for Annual.        An electronic signature was used to authenticate this note.  Kerri FORBES Pond, MD  "

## 2024-09-20 NOTE — Assessment & Plan Note (Addendum)
"   Chronic, not at goal (unstable), possible dysregulation with gut brain axis.  Possible food sensitivity considered with referral to allergy specialist.  Also recommended keeping a diet log and trial of FODMAP diet to help determine food triggers.  Symptoms proceeded cholecystectomy although recent surgery may also impact clear assessment of food triggers         "

## 2024-09-20 NOTE — Assessment & Plan Note (Addendum)
"   Chronic, not at goal (unstable), pursuing additional options for approval of previous injection medication and changes for insurance coverage through her weight management specialist.  Reasonable in the interim to try increasing metformin  with refill sent to see if this better helps with appetite reduction. Med benefits/potential side effects/risks/alternatives reviewed.     Orders:    metFORMIN  (GLUCOPHAGE -XR) 500 MG extended release tablet; Take 2 tablets by mouth every evening    "

## 2024-09-20 NOTE — Assessment & Plan Note (Addendum)
"   Chronic, at goal (stable), continue current treatment plan    Orders:    escitalopram  (LEXAPRO ) 10 MG tablet; Take 1 tablet by mouth every evening    "

## 2024-09-20 NOTE — Assessment & Plan Note (Addendum)
"   Continue metformin  with dose adjustment as noted above    Orders:    metFORMIN  (GLUCOPHAGE -XR) 500 MG extended release tablet; Take 2 tablets by mouth every evening    "

## 2024-09-20 NOTE — Assessment & Plan Note (Addendum)
"   Chronic, at goal (stable), continue current treatment plan    Orders:    ketoconazole  (NIZORAL ) 2 % shampoo; Shampoo into hair once daily x 2-4 wks    "

## 2024-09-20 NOTE — Assessment & Plan Note (Addendum)
"   Chronic, not at goal (unstable), referred to allergy/ENT specialist but also recommended nasal saline rinse with distilled water     Orders:    [facility], Nose, Throat, and Allergy Care, [facility]    "

## 2024-09-20 NOTE — Progress Notes (Signed)
 "The patient, Leah Huff, identity was verified by name and DOB.  Chief Complaint   Patient presents with    Annual Exam     No LMP recorded. (Menstrual status: IUD).  There were no vitals taken for this visit.       No data to display              No data recorded  Have you been to the ER, urgent care clinic since your last visit?  Hospitalized since your last visit?    YES - When: approximately 1 months ago.  Where and Why: [facility] gallbladder removed.    Have you seen or consulted any other health care providers outside our system since your last visit?    NO     Have you had a pap smear?    YES - Where[facility] to request most recent records if not in the chart    No cervical cancer screening on file              Social History     Substance and Sexual Activity   Sexual Activity Yes    Partners: Male     Medication list reviewed and active medications noted. Patient is taking medications as directed.  See documentation in medication activity.  Allergies: allergy list reviewed, no new allergies added            Previous Responses        T Mh Fam       Question 09/04/2024  7:10 AM EDT - Filed by Patient    Medical History     ADD/ADHD       Chronic Kidney Disease       Hypothyroidism       Allergic Rhinitis       COPD       Irritable bowel syndrome       Anxiety Yes    Coronary artery disease       Liver disease       Arthritis, Osteo       Depression       Long term anticoagulant use       Congenital heart disease       Diabetes Mellitus Type II       Neuropathy       Asthma       Emphysema       Obesity       Atrial fibrillation       Peripheral Vascular Disease       Bipolar disorder       Fibromyalgia       Restless legs syndrome       Cancer       Gastrointestinal Problems       Seizures       Carotid disease       Headaches       Sleep Apnea       Stroke       Hyperlipidemia       Substance abuse       Congestive heart failure       High blood pressure       Urinary incontinence        Chronic back pain       Hyperthyroidism       HIV/AIDS       Sickle cell       Diabetes mellitus       Hearing loss  Surgical History     Cardiac device       Colonoscopy       Spine surgery       Cosmetic surgery       C-Section Yes    Appendectomy       Mastectomy - bilateral       Mastectomy - unilateral       Eye surgery       CABG       Hip surgery       Tubal ligation       Cholecystectomy Yes    Hernia - Umbilical       Colectomy - total       Colectomy - partial       Shoulder surgery       Knee surgery       Tonsillectomy       Hernia - Inguinal       Hysterectomy - cervix intact       Hysterectomy - cervix removed       Upper GI endoscopy       Cardiac catheterization       Tobacco Use Never    Smokeless Tobacco Never    Alcohol Use Not Currently    Drinks/Week     Glasses of wine (range: at least 0)       Cans of beer (range: at least 0)       Shots of liquor (range: at least 0)       Standard drinks or equivalent (range: at least 0)       Comments?       Drugs of Abuse ([place] only; not prescribed for treatment) Yes    Per Week (range: at least 0)       Types       Comments?       Sexually Active Yes    Partners Female    Comments?       Family History Refer to Family History Response table below              Thersia Pesa, MA       "

## 2024-09-20 NOTE — Assessment & Plan Note (Addendum)
"   Discussed with patient concerns regarding regular marijuana use.  And advised minimizing or avoiding use entirely.         "
